# Patient Record
Sex: Male | Born: 1985 | Race: Black or African American | Hispanic: No | Marital: Single
Health system: Southern US, Community
[De-identification: ages and names within clinical notes are randomized; demographics above are authoritative.]

## PROBLEM LIST (undated history)

## (undated) DIAGNOSIS — R569 Unspecified convulsions: Secondary | ICD-10-CM

## (undated) DIAGNOSIS — Z945 Skin transplant status: Secondary | ICD-10-CM

## (undated) DIAGNOSIS — I82409 Acute embolism and thrombosis of unspecified deep veins of unspecified lower extremity: Secondary | ICD-10-CM

## (undated) DIAGNOSIS — S069XAA Unspecified intracranial injury with loss of consciousness status unknown, initial encounter: Secondary | ICD-10-CM

## (undated) HISTORY — DX: Unspecified convulsions: R56.9

---
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
## 2010-05-19 ENCOUNTER — Ambulatory Visit (INDEPENDENT_AMBULATORY_CARE_PROVIDER_SITE_OTHER): Payer: Self-pay

## 2010-05-19 ENCOUNTER — Inpatient Hospital Stay (INDEPENDENT_AMBULATORY_CARE_PROVIDER_SITE_OTHER)
Admission: RE | Admit: 2010-05-19 | Discharge: 2010-05-19 | Disposition: A | Discharge status: Home / Self Care | Payer: Self-pay | Source: Ambulatory Visit | Attending: Emergency Medicine | Admitting: Emergency Medicine

## 2010-05-19 DIAGNOSIS — S6390XA Sprain of unspecified part of unspecified wrist and hand, initial encounter: Secondary | ICD-10-CM

## 2010-05-19 DIAGNOSIS — R05 Cough: Secondary | ICD-10-CM

## 2010-08-01 ENCOUNTER — Emergency Department (HOSPITAL_COMMUNITY)
Admission: EM | Admit: 2010-08-01 | Discharge: 2010-08-01 | Disposition: A | Discharge status: Home / Self Care | Payer: Self-pay | Attending: Emergency Medicine | Admitting: Emergency Medicine

## 2010-08-01 ENCOUNTER — Emergency Department (HOSPITAL_COMMUNITY): Payer: Self-pay

## 2010-08-01 DIAGNOSIS — S81809A Unspecified open wound, unspecified lower leg, initial encounter: Secondary | ICD-10-CM | POA: Insufficient documentation

## 2010-08-01 DIAGNOSIS — S81009A Unspecified open wound, unspecified knee, initial encounter: Secondary | ICD-10-CM | POA: Insufficient documentation

## 2010-08-01 DIAGNOSIS — W3400XA Accidental discharge from unspecified firearms or gun, initial encounter: Secondary | ICD-10-CM | POA: Insufficient documentation

## 2010-08-01 DIAGNOSIS — M79609 Pain in unspecified limb: Secondary | ICD-10-CM | POA: Insufficient documentation
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
## 2010-08-28 ENCOUNTER — Emergency Department (HOSPITAL_COMMUNITY)
Admission: EM | Admit: 2010-08-28 | Discharge: 2010-08-28 | Disposition: A | Discharge status: Home / Self Care | Payer: Self-pay | Attending: Emergency Medicine | Admitting: Emergency Medicine

## 2010-08-28 ENCOUNTER — Emergency Department (HOSPITAL_COMMUNITY): Payer: Self-pay

## 2010-08-28 DIAGNOSIS — M7989 Other specified soft tissue disorders: Secondary | ICD-10-CM | POA: Insufficient documentation

## 2010-08-28 DIAGNOSIS — S61409A Unspecified open wound of unspecified hand, initial encounter: Secondary | ICD-10-CM | POA: Insufficient documentation

## 2010-08-28 DIAGNOSIS — M79609 Pain in unspecified limb: Secondary | ICD-10-CM | POA: Insufficient documentation

## 2010-08-28 DIAGNOSIS — Y92009 Unspecified place in unspecified non-institutional (private) residence as the place of occurrence of the external cause: Secondary | ICD-10-CM | POA: Insufficient documentation

## 2010-08-28 LAB — URINALYSIS, ROUTINE W REFLEX MICROSCOPIC
Leukocytes, UA: NEGATIVE
Nitrite: NEGATIVE
Specific Gravity, Urine: 1.025 (ref 1.005–1.030)
pH: 6 (ref 5.0–8.0)

## 2010-08-28 LAB — DIFFERENTIAL
Eosinophils Absolute: 0.1 10*3/uL (ref 0.0–0.7)
Lymphocytes Relative: 17 % (ref 12–46)
Lymphs Abs: 1.3 10*3/uL (ref 0.7–4.0)
Monocytes Relative: 9 % (ref 3–12)
Neutro Abs: 5.6 10*3/uL (ref 1.7–7.7)
Neutrophils Relative %: 72 % (ref 43–77)

## 2010-08-28 LAB — ETHANOL: Alcohol, Ethyl (B): 182 mg/dL — ABNORMAL HIGH (ref 0–11)

## 2010-08-28 LAB — BASIC METABOLIC PANEL
Calcium: 9.2 mg/dL (ref 8.4–10.5)
GFR calc Af Amer: >60 mL/min (ref >60)
GFR calc non Af Amer: >60 mL/min (ref >60)
Potassium: 4.8 mEq/L (ref 3.5–5.1)
Sodium: 140 mEq/L (ref 135–145)

## 2010-08-28 LAB — CBC
MCV: 82.4 fL (ref 78.0–100.0)
Platelets: 189 10*3/uL (ref 150–400)
RBC: 4.78 MIL/uL (ref 4.22–5.81)
WBC: 7.7 10*3/uL (ref 4.0–10.5)

## 2010-08-28 LAB — RAPID URINE DRUG SCREEN, HOSP PERFORMED
Benzodiazepines: NOT DETECTED
Cocaine: NOT DETECTED

## 2012-07-08 ENCOUNTER — Encounter (HOSPITAL_COMMUNITY): Payer: Self-pay | Admitting: *Deleted

## 2012-07-08 ENCOUNTER — Emergency Department (HOSPITAL_COMMUNITY)
Admission: EM | Admit: 2012-07-08 | Discharge: 2012-07-09 | Disposition: A | Discharge status: Home / Self Care | Payer: Self-pay | Attending: Emergency Medicine | Admitting: Emergency Medicine

## 2012-07-08 DIAGNOSIS — S31109A Unspecified open wound of abdominal wall, unspecified quadrant without penetration into peritoneal cavity, initial encounter: Secondary | ICD-10-CM | POA: Insufficient documentation

## 2012-07-08 DIAGNOSIS — S31119A Laceration without foreign body of abdominal wall, unspecified quadrant without penetration into peritoneal cavity, initial encounter: Secondary | ICD-10-CM

## 2012-07-08 DIAGNOSIS — F17200 Nicotine dependence, unspecified, uncomplicated: Secondary | ICD-10-CM | POA: Insufficient documentation

## 2012-07-08 DIAGNOSIS — T7411XA Adult physical abuse, confirmed, initial encounter: Secondary | ICD-10-CM | POA: Insufficient documentation

## 2012-07-08 MED ORDER — CEFAZOLIN SODIUM-DEXTROSE 2-3 GM-% IV SOLR
2.0000 g | Freq: Once | INTRAVENOUS | Status: AC
  Administered 2012-07-08: 2 g via INTRAVENOUS
  Filled 2012-07-08: qty 50

## 2012-07-08 MED ORDER — CEFAZOLIN SODIUM 1-5 GM-% IV SOLN: 1.0000 g | Freq: Once | INTRAVENOUS | Status: DC

## 2012-07-08 MED ORDER — HYDROCODONE-ACETAMINOPHEN 5-325 MG PO TABS: 2.0000 | ORAL_TABLET | ORAL | Status: DC | PRN

## 2012-07-08 MED ORDER — CEPHALEXIN 500 MG PO CAPS: 500.0000 mg | ORAL_CAPSULE | Freq: Four times a day (QID) | ORAL | Status: DC
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
## 2012-07-09 ENCOUNTER — Encounter (HOSPITAL_COMMUNITY): Payer: Self-pay | Admitting: *Deleted

## 2012-07-19 ENCOUNTER — Encounter (HOSPITAL_COMMUNITY): Payer: Self-pay | Admitting: Adult Health

## 2012-07-19 ENCOUNTER — Emergency Department (HOSPITAL_COMMUNITY)
Admission: EM | Admit: 2012-07-19 | Discharge: 2012-07-19 | Disposition: A | Discharge status: Home / Self Care | Payer: Self-pay | Attending: Emergency Medicine | Admitting: Emergency Medicine

## 2012-07-19 DIAGNOSIS — Z4802 Encounter for removal of sutures: Secondary | ICD-10-CM | POA: Insufficient documentation

## 2012-07-19 DIAGNOSIS — F17200 Nicotine dependence, unspecified, uncomplicated: Secondary | ICD-10-CM | POA: Insufficient documentation

## 2012-07-19 NOTE — ED Notes (Signed)
17 staples to right flank and side, placed 10 days ago. Pt requesting removal. Incision site cdi.
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
## 2012-07-19 NOTE — ED Notes (Signed)
PT comfortable with d/c and f/u instructions. No prescriptions. 

## 2012-07-20 NOTE — ED Provider Notes (Signed)
Medical screening examination/treatment/procedure(s) were performed by non-physician practitioner and as supervising physician I was immediately available for consultation/collaboration.  Lyanne Co, MD 07/20/12 [phone]

## 2015-02-12 ENCOUNTER — Emergency Department (INDEPENDENT_AMBULATORY_CARE_PROVIDER_SITE_OTHER)
Admission: EM | Admit: 2015-02-12 | Discharge: 2015-02-12 | Disposition: A | Discharge status: Home / Self Care | Payer: Self-pay | Source: Home / Self Care | Attending: Family Medicine | Admitting: Family Medicine

## 2015-02-12 ENCOUNTER — Encounter (HOSPITAL_COMMUNITY): Payer: Self-pay | Admitting: *Deleted

## 2015-02-12 ENCOUNTER — Other Ambulatory Visit (HOSPITAL_COMMUNITY)
Admission: RE | Admit: 2015-02-12 | Discharge: 2015-02-12 | Disposition: A | Discharge status: Home / Self Care | Payer: Self-pay | Source: Ambulatory Visit | Attending: Family Medicine | Admitting: Family Medicine

## 2015-02-12 DIAGNOSIS — Z711 Person with feared health complaint in whom no diagnosis is made: Secondary | ICD-10-CM

## 2015-02-12 DIAGNOSIS — Z113 Encounter for screening for infections with a predominantly sexual mode of transmission: Secondary | ICD-10-CM | POA: Insufficient documentation

## 2015-02-12 MED ORDER — AZITHROMYCIN 250 MG PO TABS
1000.0000 mg | ORAL_TABLET | Freq: Once | ORAL | Status: AC
  Administered 2015-02-12: 1000 mg via ORAL

## 2015-02-12 MED ORDER — CEFTRIAXONE SODIUM 250 MG IJ SOLR
INTRAMUSCULAR | Status: AC
  Filled 2015-02-12: qty 250

## 2015-02-12 MED ORDER — AZITHROMYCIN 250 MG PO TABS
ORAL_TABLET | ORAL | Status: AC
  Filled 2015-02-12: qty 4

## 2015-02-12 MED ORDER — CEFTRIAXONE SODIUM 250 MG IJ SOLR
250.0000 mg | Freq: Once | INTRAMUSCULAR | Status: AC
  Administered 2015-02-12: 250 mg via INTRAMUSCULAR

## 2015-02-12 NOTE — Discharge Instructions (Signed)
We will call with positive test results and treat as indicated  °
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
## 2015-02-12 NOTE — ED Notes (Signed)
Pt reports   Symptoms    Of        Burning   When   He  Urinates  As  Well  As  A  Penile  Discharge      Symptoms  Started  yesterday

## 2015-02-13 LAB — CYTOLOGY, (ORAL, ANAL, URETHRAL) ANCILLARY ONLY
CHLAMYDIA, DNA PROBE: POSITIVE — AB
Neisseria Gonorrhea: NEGATIVE
Trichomonas: NEGATIVE

## 2015-02-17 ENCOUNTER — Telehealth (HOSPITAL_COMMUNITY): Payer: Self-pay | Admitting: Emergency Medicine
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
## 2017-06-02 ENCOUNTER — Emergency Department (HOSPITAL_COMMUNITY)
Admission: EM | Admit: 2017-06-02 | Discharge: 2017-06-02 | Disposition: A | Discharge status: Home / Self Care | Payer: Self-pay | Attending: Emergency Medicine | Admitting: Emergency Medicine

## 2017-06-02 ENCOUNTER — Encounter (HOSPITAL_COMMUNITY): Payer: Self-pay

## 2017-06-02 DIAGNOSIS — F17210 Nicotine dependence, cigarettes, uncomplicated: Secondary | ICD-10-CM | POA: Insufficient documentation

## 2017-06-02 DIAGNOSIS — H209 Unspecified iridocyclitis: Secondary | ICD-10-CM

## 2017-06-02 DIAGNOSIS — H2000 Unspecified acute and subacute iridocyclitis: Secondary | ICD-10-CM | POA: Insufficient documentation

## 2017-06-02 HISTORY — DX: Skin transplant status: Z94.5

## 2017-06-02 MED ORDER — ATROPINE SULFATE 1 % OP SOLN
1.0000 [drp] | Freq: Once | OPHTHALMIC | Status: AC
  Administered 2017-06-02: 1 [drp] via OPHTHALMIC
  Filled 2017-06-02: qty 2

## 2017-06-02 MED ORDER — ERYTHROMYCIN 5 MG/GM OP OINT: TOPICAL_OINTMENT | OPHTHALMIC | 0 refills | Status: DC

## 2017-06-02 MED ORDER — OXYCODONE-ACETAMINOPHEN 5-325 MG PO TABS
2.0000 | ORAL_TABLET | Freq: Once | ORAL | Status: AC
  Administered 2017-06-02: 2 via ORAL
  Filled 2017-06-02: qty 2

## 2017-06-02 MED ORDER — TETRACAINE HCL 0.5 % OP SOLN
2.0000 [drp] | Freq: Once | OPHTHALMIC | Status: AC
  Administered 2017-06-02: 2 [drp] via OPHTHALMIC
  Filled 2017-06-02: qty 4

## 2017-06-02 MED ORDER — HOMATROPINE HBR 2 % OP SOLN
1.0000 [drp] | Freq: Once | OPHTHALMIC | Status: DC
  Filled 2017-06-02: qty 5

## 2017-06-02 MED ORDER — PREDNISOLONE ACETATE 1 % OP SUSP
1.0000 [drp] | Freq: Four times a day (QID) | OPHTHALMIC | Status: DC
  Filled 2017-06-02: qty 1

## 2017-06-02 MED ORDER — FLUORESCEIN SODIUM 1 MG OP STRP
1.0000 | ORAL_STRIP | Freq: Once | OPHTHALMIC | Status: AC
  Administered 2017-06-02: 1 via OPHTHALMIC
  Filled 2017-06-02: qty 1

## 2017-06-02 MED ORDER — HYDROCODONE-ACETAMINOPHEN 5-325 MG PO TABS: 1.0000 | ORAL_TABLET | Freq: Four times a day (QID) | ORAL | 0 refills | Status: DC | PRN

## 2017-06-02 NOTE — Discharge Instructions (Addendum)
Start using the PRED-FORTE eye drops four times a day  Do not scratch or rub your eye, as this can cause cuts or abrasions which can become infected. I've given you a mild antibiotic ointment to use to help keep your eye from getting infected.  It is very important that you follow-up tomorrow.  Failure to do so could result in worsening vision, loss of vision, pain, infection, or other complications.

## 2017-06-02 NOTE — ED Triage Notes (Signed)
Pt states that his R eye was watering and irritated yesterday, today unable to open his eye due to pain and drainage, conjunctiva very red
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
## 2017-06-02 NOTE — ED Notes (Signed)
Patient verbalizes understanding of discharge instructions. Opportunity for questioning and answers were provided. Armband removed by staff, pt discharged from ED ambulatory.   

## 2017-08-25 ENCOUNTER — Encounter (HOSPITAL_COMMUNITY): Payer: Self-pay | Admitting: Emergency Medicine

## 2017-08-25 ENCOUNTER — Ambulatory Visit (HOSPITAL_COMMUNITY)
Admission: EM | Admit: 2017-08-25 | Discharge: 2017-08-25 | Disposition: A | Discharge status: Home / Self Care | Payer: Self-pay | Attending: Family Medicine | Admitting: Family Medicine

## 2017-08-25 DIAGNOSIS — Z202 Contact with and (suspected) exposure to infections with a predominantly sexual mode of transmission: Secondary | ICD-10-CM

## 2017-08-25 DIAGNOSIS — Z113 Encounter for screening for infections with a predominantly sexual mode of transmission: Secondary | ICD-10-CM | POA: Insufficient documentation

## 2017-08-25 MED ORDER — METRONIDAZOLE 500 MG PO TABS: 500.0000 mg | ORAL_TABLET | Freq: Two times a day (BID) | ORAL | 0 refills | Status: DC

## 2017-08-25 NOTE — Discharge Instructions (Addendum)
You have been given the following medications today for treatment of suspected trichomoniasis:  Meds ordered this encounter  Medications   metroNIDAZOLE (FLAGYL) 500 MG tablet    Sig: Take 1 tablet (500 mg total) by mouth 2 (two) times daily.    Dispense:  14 tablet    Refill:  0   Even though we have treated you today, we have sent testing for sexually transmitted infections. We will notify you of any positive results once they are received. If required, we will prescribe any medications you might need.  Please refrain from all sexual activity for at least the next seven days.

## 2017-08-25 NOTE — ED Triage Notes (Signed)
PT reports "tingling" in genital area. A partner was positive for trich.
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
## 2017-08-26 LAB — URINE CYTOLOGY ANCILLARY ONLY
Chlamydia: NEGATIVE
NEISSERIA GONORRHEA: NEGATIVE
TRICH (WINDOWPATH): NEGATIVE

## 2018-02-16 ENCOUNTER — Encounter (HOSPITAL_COMMUNITY): Payer: Self-pay | Admitting: Emergency Medicine

## 2018-02-16 ENCOUNTER — Ambulatory Visit (HOSPITAL_COMMUNITY)
Admission: EM | Admit: 2018-02-16 | Discharge: 2018-02-16 | Disposition: A | Discharge status: Home / Self Care | Payer: Self-pay | Attending: Family Medicine | Admitting: Family Medicine

## 2018-02-16 DIAGNOSIS — K047 Periapical abscess without sinus: Secondary | ICD-10-CM | POA: Insufficient documentation

## 2018-02-16 MED ORDER — AMOXICILLIN-POT CLAVULANATE 875-125 MG PO TABS: 1.0000 | ORAL_TABLET | Freq: Two times a day (BID) | ORAL | 0 refills | Status: DC

## 2018-02-16 NOTE — ED Triage Notes (Signed)
[facility] dental pain x 3-4 days.  Pt states he took 2 800mg  ibuprofen prior to arrival.

## 2018-02-16 NOTE — Discharge Instructions (Signed)
We are treating you for a dental infection You can continue the ibuprofen for pain and inflammation Follow-up with a dentist for further evaluation management
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
## 2018-11-03 ENCOUNTER — Other Ambulatory Visit: Payer: Self-pay

## 2018-11-03 ENCOUNTER — Ambulatory Visit (HOSPITAL_COMMUNITY)
Admission: EM | Admit: 2018-11-03 | Discharge: 2018-11-03 | Disposition: A | Discharge status: Home / Self Care | Payer: Self-pay | Attending: Physician Assistant | Admitting: Physician Assistant

## 2018-11-03 ENCOUNTER — Encounter (HOSPITAL_COMMUNITY): Payer: Self-pay

## 2018-11-03 ENCOUNTER — Emergency Department (HOSPITAL_COMMUNITY)
Admission: EM | Admit: 2018-11-03 | Discharge: 2018-11-03 | Disposition: A | Discharge status: Home / Self Care | Payer: Self-pay

## 2018-11-03 DIAGNOSIS — S0181XA Laceration without foreign body of other part of head, initial encounter: Secondary | ICD-10-CM

## 2018-11-03 DIAGNOSIS — S0081XA Abrasion of other part of head, initial encounter: Secondary | ICD-10-CM

## 2018-11-03 DIAGNOSIS — S0990XA Unspecified injury of head, initial encounter: Secondary | ICD-10-CM
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
## 2018-11-03 NOTE — Discharge Instructions (Addendum)
Closed the left eyebrow injury as best as I could. Keep all areas clean and dry. Do not pull the steri-strips off let them come off on their own. If you develop worsening headaches, nausea or vomiting then please go to the ED.

## 2018-11-03 NOTE — ED Notes (Signed)
Pt's name called for triage no answer x1 
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
## 2018-11-04 ENCOUNTER — Telehealth (HOSPITAL_COMMUNITY): Payer: Self-pay | Admitting: Emergency Medicine

## 2018-11-05 ENCOUNTER — Emergency Department (HOSPITAL_COMMUNITY): Payer: Self-pay

## 2018-11-05 ENCOUNTER — Encounter (HOSPITAL_COMMUNITY): Payer: Self-pay | Admitting: Emergency Medicine

## 2018-11-05 ENCOUNTER — Other Ambulatory Visit: Payer: Self-pay

## 2018-11-05 ENCOUNTER — Emergency Department (HOSPITAL_COMMUNITY)
Admission: EM | Admit: 2018-11-05 | Discharge: 2018-11-05 | Disposition: A | Discharge status: Home / Self Care | Payer: Self-pay | Attending: Emergency Medicine | Admitting: Emergency Medicine

## 2018-11-05 DIAGNOSIS — G8911 Acute pain due to trauma: Secondary | ICD-10-CM | POA: Insufficient documentation

## 2018-11-05 DIAGNOSIS — F17210 Nicotine dependence, cigarettes, uncomplicated: Secondary | ICD-10-CM | POA: Insufficient documentation

## 2018-11-05 DIAGNOSIS — S0993XD Unspecified injury of face, subsequent encounter: Secondary | ICD-10-CM

## 2018-11-05 DIAGNOSIS — S01112D Laceration without foreign body of left eyelid and periocular area, subsequent encounter: Secondary | ICD-10-CM | POA: Diagnosis not present

## 2018-11-05 DIAGNOSIS — Z09 Encounter for follow-up examination after completed treatment for conditions other than malignant neoplasm: Secondary | ICD-10-CM | POA: Diagnosis present

## 2018-11-05 MED ORDER — FOLIC ACID 1 MG PO TABS
1.0000 mg | ORAL_TABLET | Freq: Once | ORAL | Status: AC
  Administered 2018-11-05: 1 mg via ORAL
  Filled 2018-11-05: qty 1

## 2018-11-05 MED ORDER — THIAMINE HCL 100 MG/ML IJ SOLN: 100.0000 mg | Freq: Once | INTRAMUSCULAR | Status: DC

## 2018-11-05 MED ORDER — VITAMIN B-1 100 MG PO TABS
50.0000 mg | ORAL_TABLET | Freq: Once | ORAL | Status: AC
  Administered 2018-11-05: 50 mg via ORAL
  Filled 2018-11-05: qty 1

## 2018-11-05 MED ORDER — CEPHALEXIN 500 MG PO CAPS: 500.0000 mg | ORAL_CAPSULE | Freq: Four times a day (QID) | ORAL | 0 refills | Status: AC
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
## 2018-11-05 NOTE — ED Notes (Signed)
Patient transported to CT 
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
## 2019-10-08 ENCOUNTER — Encounter (HOSPITAL_COMMUNITY): Payer: Self-pay | Admitting: Neurological Surgery

## 2019-10-08 ENCOUNTER — Inpatient Hospital Stay (HOSPITAL_COMMUNITY): Payer: No Typology Code available for payment source

## 2019-10-08 ENCOUNTER — Emergency Department (HOSPITAL_COMMUNITY): Payer: No Typology Code available for payment source

## 2019-10-08 ENCOUNTER — Other Ambulatory Visit: Payer: Self-pay

## 2019-10-08 ENCOUNTER — Inpatient Hospital Stay (HOSPITAL_COMMUNITY)
Admission: EM | Admit: 2019-10-08 | Discharge: 2019-10-11 | DRG: 087 | Disposition: A | Discharge status: Home / Self Care | Payer: No Typology Code available for payment source | Attending: Neurological Surgery | Admitting: Neurological Surgery

## 2019-10-08 DIAGNOSIS — R402412 Glasgow coma scale score 13-15, at arrival to emergency department: Secondary | ICD-10-CM | POA: Diagnosis present

## 2019-10-08 DIAGNOSIS — Z20822 Contact with and (suspected) exposure to covid-19: Secondary | ICD-10-CM | POA: Diagnosis present

## 2019-10-08 DIAGNOSIS — S065XAA Traumatic subdural hemorrhage with loss of consciousness status unknown, initial encounter: Secondary | ICD-10-CM | POA: Diagnosis present

## 2019-10-08 DIAGNOSIS — F10129 Alcohol abuse with intoxication, unspecified: Secondary | ICD-10-CM | POA: Diagnosis present

## 2019-10-08 DIAGNOSIS — S066X1A Traumatic subarachnoid hemorrhage with loss of consciousness of 30 minutes or less, initial encounter: Principal | ICD-10-CM | POA: Diagnosis present

## 2019-10-08 DIAGNOSIS — S065X1A Traumatic subdural hemorrhage with loss of consciousness of 30 minutes or less, initial encounter: Secondary | ICD-10-CM | POA: Diagnosis present

## 2019-10-08 DIAGNOSIS — I629 Nontraumatic intracranial hemorrhage, unspecified: Secondary | ICD-10-CM

## 2019-10-08 DIAGNOSIS — F17200 Nicotine dependence, unspecified, uncomplicated: Secondary | ICD-10-CM | POA: Diagnosis present

## 2019-10-08 DIAGNOSIS — S065X9A Traumatic subdural hemorrhage with loss of consciousness of unspecified duration, initial encounter: Secondary | ICD-10-CM | POA: Diagnosis not present

## 2019-10-08 LAB — COMPREHENSIVE METABOLIC PANEL
ALT: 53 U/L — ABNORMAL HIGH (ref 0–44)
AST: 68 U/L — ABNORMAL HIGH (ref 15–41)
Albumin: 4.5 g/dL (ref 3.5–5.0)
Alkaline Phosphatase: 62 U/L (ref 38–126)
Anion gap: 12 (ref 5–15)
BUN: 9 mg/dL (ref 6–20)
CO2: 24 mmol/L (ref 22–32)
Calcium: 8.8 mg/dL — ABNORMAL LOW (ref 8.9–10.3)
Chloride: 107 mmol/L (ref 98–111)
Creatinine, Ser: 0.94 mg/dL (ref 0.61–1.24)
GFR calc Af Amer: >60 mL/min (ref >60)
GFR calc non Af Amer: >60 mL/min (ref >60)
Glucose, Bld: 100 mg/dL — ABNORMAL HIGH (ref 70–99)
Potassium: 3.4 mmol/L — ABNORMAL LOW (ref 3.5–5.1)
Sodium: 143 mmol/L (ref 135–145)
Total Bilirubin: 0.8 mg/dL (ref 0.3–1.2)
Total Protein: 7.6 g/dL (ref 6.5–8.1)

## 2019-10-08 LAB — CBC
HCT: 32.7 % — ABNORMAL LOW (ref 39.0–52.0)
Hemoglobin: 10.2 g/dL — ABNORMAL LOW (ref 13.0–17.0)
MCH: 27.1 pg (ref 26.0–34.0)
MCHC: 31.2 g/dL (ref 30.0–36.0)
MCV: 86.7 fL (ref 80.0–100.0)
Platelets: 147 10*3/uL — ABNORMAL LOW (ref 150–400)
RBC: 3.77 MIL/uL — ABNORMAL LOW (ref 4.22–5.81)
RDW: 13.7 % (ref 11.5–15.5)
WBC: 8.1 10*3/uL (ref 4.0–10.5)
nRBC: 0 % (ref 0.0–0.2)

## 2019-10-08 LAB — MRSA PCR SCREENING: MRSA by PCR: NEGATIVE

## 2019-10-08 LAB — I-STAT CHEM 8, ED
BUN: 9 mg/dL (ref 6–20)
Calcium, Ion: 1.04 mmol/L — ABNORMAL LOW (ref 1.15–1.40)
Chloride: 106 mmol/L (ref 98–111)
Creatinine, Ser: 1.2 mg/dL (ref 0.61–1.24)
Glucose, Bld: 97 mg/dL (ref 70–99)
HCT: 46 % (ref 39.0–52.0)
Hemoglobin: 15.6 g/dL (ref 13.0–17.0)
Potassium: 3.5 mmol/L (ref 3.5–5.1)
Sodium: 145 mmol/L (ref 135–145)
TCO2: 24 mmol/L (ref 22–32)

## 2019-10-08 LAB — SAMPLE TO BLOOD BANK

## 2019-10-08 LAB — LACTIC ACID, PLASMA: Lactic Acid, Venous: 2.8 mmol/L (ref 0.5–1.9)

## 2019-10-08 LAB — ETHANOL: Alcohol, Ethyl (B): 198 mg/dL — ABNORMAL HIGH (ref <10)

## 2019-10-08 LAB — PROTIME-INR
INR: 1 (ref 0.8–1.2)
Prothrombin Time: 12.8 seconds (ref 11.4–15.2)

## 2019-10-08 LAB — SARS CORONAVIRUS 2 BY RT PCR (HOSPITAL ORDER, PERFORMED IN ~~LOC~~ HOSPITAL LAB): SARS Coronavirus 2: NEGATIVE

## 2019-10-08 MED ORDER — POLYETHYLENE GLYCOL 3350 17 G PO PACK: 17.0000 g | PACK | Freq: Every day | ORAL | Status: DC | PRN

## 2019-10-08 MED ORDER — ADULT MULTIVITAMIN W/MINERALS CH: 1.0000 | ORAL_TABLET | Freq: Every day | ORAL | Status: DC

## 2019-10-08 MED ORDER — CHLORHEXIDINE GLUCONATE CLOTH 2 % EX PADS
6.0000 | MEDICATED_PAD | Freq: Every day | CUTANEOUS | Status: DC
  Administered 2019-10-08: 6 via TOPICAL

## 2019-10-08 MED ORDER — DOCUSATE SODIUM 100 MG PO CAPS: 100.0000 mg | ORAL_CAPSULE | Freq: Two times a day (BID) | ORAL | Status: DC | PRN

## 2019-10-08 MED ORDER — FOLIC ACID 1 MG PO TABS: 1.0000 mg | ORAL_TABLET | Freq: Every day | ORAL | Status: DC

## 2019-10-08 MED ORDER — LEVETIRACETAM IN NACL 500 MG/100ML IV SOLN
500.0000 mg | Freq: Two times a day (BID) | INTRAVENOUS | Status: DC
  Administered 2019-10-09: 500 mg via INTRAVENOUS
  Filled 2019-10-08: qty 100

## 2019-10-08 MED ORDER — HYDROCODONE-ACETAMINOPHEN 5-325 MG PO TABS
1.0000 | ORAL_TABLET | Freq: Four times a day (QID) | ORAL | Status: DC | PRN
  Administered 2019-10-09 – 2019-10-10 (×4): 1 via ORAL
  Filled 2019-10-08 (×5): qty 1

## 2019-10-08 MED ORDER — IOHEXOL 300 MG/ML  SOLN
100.0000 mL | Freq: Once | INTRAMUSCULAR | Status: AC | PRN
  Administered 2019-10-08: 100 mL via INTRAVENOUS

## 2019-10-08 MED ORDER — ADULT MULTIVITAMIN W/MINERALS CH
1.0000 | ORAL_TABLET | Freq: Every day | ORAL | Status: DC
  Administered 2019-10-08 – 2019-10-11 (×3): 1 via ORAL
  Filled 2019-10-08 (×3): qty 1

## 2019-10-08 MED ORDER — LORAZEPAM 2 MG/ML IJ SOLN: 1.0000 mg | INTRAMUSCULAR | Status: DC | PRN

## 2019-10-08 MED ORDER — ONDANSETRON HCL 4 MG/2ML IJ SOLN
4.0000 mg | Freq: Four times a day (QID) | INTRAMUSCULAR | Status: DC | PRN
  Administered 2019-10-09: 4 mg via INTRAVENOUS
  Filled 2019-10-08: qty 2

## 2019-10-08 MED ORDER — ACETAMINOPHEN 500 MG PO TABS
1000.0000 mg | ORAL_TABLET | Freq: Once | ORAL | Status: AC
  Administered 2019-10-08: 1000 mg via ORAL
  Filled 2019-10-08: qty 2

## 2019-10-08 MED ORDER — LEVETIRACETAM IN NACL 1000 MG/100ML IV SOLN
1000.0000 mg | Freq: Once | INTRAVENOUS | Status: AC
  Administered 2019-10-08: 1000 mg via INTRAVENOUS
  Filled 2019-10-08: qty 100

## 2019-10-08 MED ORDER — LORAZEPAM 1 MG PO TABS
1.0000 mg | ORAL_TABLET | ORAL | Status: DC | PRN
  Administered 2019-10-09 – 2019-10-10 (×2): 2 mg via ORAL
  Filled 2019-10-08 (×2): qty 2

## 2019-10-08 MED ORDER — THIAMINE HCL 100 MG PO TABS: 100.0000 mg | ORAL_TABLET | Freq: Every day | ORAL | Status: DC

## 2019-10-08 MED ORDER — FAMOTIDINE IN NACL 20-0.9 MG/50ML-% IV SOLN
20.0000 mg | Freq: Two times a day (BID) | INTRAVENOUS | Status: DC
  Administered 2019-10-08: 20 mg via INTRAVENOUS
  Filled 2019-10-08: qty 50

## 2019-10-08 MED ORDER — FOLIC ACID 1 MG PO TABS
1.0000 mg | ORAL_TABLET | Freq: Every day | ORAL | Status: DC
  Administered 2019-10-08 – 2019-10-11 (×3): 1 mg via ORAL
  Filled 2019-10-08 (×3): qty 1

## 2019-10-08 MED ORDER — ONDANSETRON 4 MG PO TBDP
4.0000 mg | ORAL_TABLET | Freq: Once | ORAL | Status: AC
  Administered 2019-10-08: 4 mg via ORAL
  Filled 2019-10-08: qty 1

## 2019-10-08 MED ORDER — THIAMINE HCL 100 MG/ML IJ SOLN: 100.0000 mg | Freq: Every day | INTRAMUSCULAR | Status: DC

## 2019-10-08 MED ORDER — ACETAMINOPHEN 325 MG PO TABS
650.0000 mg | ORAL_TABLET | ORAL | Status: DC | PRN
  Administered 2019-10-08: 650 mg via ORAL
  Filled 2019-10-08: qty 2

## 2019-10-08 MED ORDER — SODIUM CHLORIDE 0.9 % IV SOLN: INTRAVENOUS | Status: DC

## 2019-10-08 MED ORDER — THIAMINE HCL 100 MG PO TABS
100.0000 mg | ORAL_TABLET | Freq: Every day | ORAL | Status: DC
  Administered 2019-10-08 – 2019-10-11 (×3): 100 mg via ORAL
  Filled 2019-10-08 (×3): qty 1
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
## 2019-10-08 NOTE — ED Notes (Signed)
Nurse attempted IV access x1, unsuccessful
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
## 2019-10-08 NOTE — ED Notes (Signed)
Pt transported to CT ?

## 2019-10-09 ENCOUNTER — Inpatient Hospital Stay (HOSPITAL_COMMUNITY): Payer: No Typology Code available for payment source

## 2019-10-09 LAB — CBC
HCT: 42.9 % (ref 39.0–52.0)
Hemoglobin: 14.1 g/dL (ref 13.0–17.0)
MCH: 27.6 pg (ref 26.0–34.0)
MCHC: 32.9 g/dL (ref 30.0–36.0)
MCV: 84 fL (ref 80.0–100.0)
Platelets: 206 10*3/uL (ref 150–400)
RBC: 5.11 MIL/uL (ref 4.22–5.81)
RDW: 13.6 % (ref 11.5–15.5)
WBC: 9.8 10*3/uL (ref 4.0–10.5)
nRBC: 0 % (ref 0.0–0.2)

## 2019-10-09 LAB — BASIC METABOLIC PANEL
Anion gap: 13 (ref 5–15)
BUN: 9 mg/dL (ref 6–20)
CO2: 24 mmol/L (ref 22–32)
Calcium: 9 mg/dL (ref 8.9–10.3)
Chloride: 101 mmol/L (ref 98–111)
Creatinine, Ser: 0.93 mg/dL (ref 0.61–1.24)
GFR calc Af Amer: >60 mL/min (ref >60)
GFR calc non Af Amer: >60 mL/min (ref >60)
Glucose, Bld: 115 mg/dL — ABNORMAL HIGH (ref 70–99)
Potassium: 3.7 mmol/L (ref 3.5–5.1)
Sodium: 138 mmol/L (ref 135–145)

## 2019-10-09 LAB — HIV ANTIBODY (ROUTINE TESTING W REFLEX): HIV Screen 4th Generation wRfx: NONREACTIVE

## 2019-10-09 MED ORDER — LEVETIRACETAM 500 MG PO TABS
500.0000 mg | ORAL_TABLET | Freq: Two times a day (BID) | ORAL | Status: DC
  Administered 2019-10-09 – 2019-10-11 (×4): 500 mg via ORAL
  Filled 2019-10-09 (×4): qty 1

## 2019-10-09 NOTE — Progress Notes (Signed)
Pt refusing care and wanting to go home against medical advice.  Dr. Jake Samples at bedside and explained risks of going home against medical advice.  Will bring in [facility] paperwork for pt to sign.
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
## 2019-10-09 NOTE — Progress Notes (Signed)
Pt refusing IV and wanting to go home.  Dr Janee Morn at bedside.
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
## 2019-10-10 MED ORDER — OXYCODONE HCL 5 MG PO TABS
5.0000 mg | ORAL_TABLET | ORAL | Status: DC | PRN
  Administered 2019-10-10 (×2): 10 mg via ORAL
  Filled 2019-10-10 (×2): qty 2

## 2019-10-10 MED ORDER — HYDROMORPHONE HCL 1 MG/ML IJ SOLN
0.5000 mg | INTRAMUSCULAR | Status: DC | PRN
  Administered 2019-10-10: 1 mg via INTRAVENOUS

## 2019-10-10 MED ORDER — HYDROMORPHONE HCL 1 MG/ML PO LIQD: 0.5000 mg | ORAL | Status: DC | PRN

## 2019-10-10 MED ORDER — HYDROMORPHONE HCL 1 MG/ML IJ SOLN
INTRAMUSCULAR | Status: AC
  Filled 2019-10-10: qty 1

## 2019-10-10 MED ORDER — FAMOTIDINE 20 MG PO TABS
20.0000 mg | ORAL_TABLET | Freq: Two times a day (BID) | ORAL | Status: DC
  Administered 2019-10-10 – 2019-10-11 (×3): 20 mg via ORAL
  Filled 2019-10-10 (×3): qty 1

## 2019-10-10 NOTE — TOC CAGE-AID Note (Signed)
Transition of Care [facility]) - CAGE-AID Screening   Patient Details  Name: Perry Day MRN: [id number] Date of Birth: November 24, 1985  Transition of [facility]) CM/SW Contact:    Jimmy Picket, LCSWA Phone Number: 10/10/2019, 1:49 PM   Clinical Narrative: Pt declined to participate in assessment.    CAGE-AID Screening: Substance Abuse Screening unable to be completed due to: : Patient Refused            Jimmy Picket, Bryon Lions Clinical Social Worker [phone]
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
## 2019-10-10 NOTE — Progress Notes (Signed)
OT Cancellation Note  Patient Details Name: Perry Day MRN: [id number] DOB: Jul 10, 1985   Cancelled Treatment:    Reason Eval/Treat Not Completed: Patient declined, no reason specified (Pt rolled to face away from therapist x2 and pulled covers up over head and would not answer questions)  Perry Day 10/10/2019, 1:14 PM   Nyoka Cowden OTR/L Acute Rehabilitation Services Pager: [phone] Office: [phone]

## 2019-10-11 MED ORDER — HYDROCODONE-ACETAMINOPHEN 5-325 MG PO TABS
1.0000 | ORAL_TABLET | Freq: Four times a day (QID) | ORAL | 0 refills | Status: DC | PRN
Start: 2019-10-11 — End: 2020-04-25

## 2019-10-11 MED ORDER — LEVETIRACETAM 500 MG PO TABS: 500.0000 mg | ORAL_TABLET | Freq: Two times a day (BID) | ORAL | 0 refills | Status: DC
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
## 2019-10-11 NOTE — Progress Notes (Signed)
CSW met with pt to discuss recommendation for outpt PT follow up.  Pt did not respond verbally but nodded his head that he did not want this follow up, closed his eyes and did not respond to further questions from [place]. Winferd Humphrey, MSW, LCSW 10/11/2019 11:57 AM

## 2019-10-13 ENCOUNTER — Other Ambulatory Visit: Payer: Self-pay

## 2019-10-13 DIAGNOSIS — F17200 Nicotine dependence, unspecified, uncomplicated: Secondary | ICD-10-CM | POA: Diagnosis present

## 2019-10-13 DIAGNOSIS — Z5320 Procedure and treatment not carried out because of patient's decision for unspecified reasons: Secondary | ICD-10-CM | POA: Diagnosis not present

## 2019-10-13 DIAGNOSIS — R40241 Glasgow coma scale score 13-15, unspecified time: Secondary | ICD-10-CM | POA: Diagnosis present

## 2019-10-13 DIAGNOSIS — Z79899 Other long term (current) drug therapy: Secondary | ICD-10-CM

## 2019-10-13 DIAGNOSIS — Y92410 Unspecified street and highway as the place of occurrence of the external cause: Secondary | ICD-10-CM

## 2019-10-13 DIAGNOSIS — R519 Headache, unspecified: Secondary | ICD-10-CM | POA: Diagnosis not present

## 2019-10-13 DIAGNOSIS — S066X9A Traumatic subarachnoid hemorrhage with loss of consciousness of unspecified duration, initial encounter: Secondary | ICD-10-CM | POA: Diagnosis not present

## 2019-10-13 DIAGNOSIS — Z20822 Contact with and (suspected) exposure to covid-19: Secondary | ICD-10-CM | POA: Diagnosis present

## 2019-10-13 DIAGNOSIS — Z5329 Procedure and treatment not carried out because of patient's decision for other reasons: Secondary | ICD-10-CM | POA: Diagnosis not present

## 2019-10-13 DIAGNOSIS — S061X9A Traumatic cerebral edema with loss of consciousness of unspecified duration, initial encounter: Secondary | ICD-10-CM | POA: Diagnosis present

## 2019-10-14 ENCOUNTER — Encounter (HOSPITAL_COMMUNITY): Payer: Self-pay | Admitting: Emergency Medicine

## 2019-10-14 ENCOUNTER — Emergency Department (HOSPITAL_COMMUNITY): Payer: No Typology Code available for payment source

## 2019-10-14 ENCOUNTER — Inpatient Hospital Stay (HOSPITAL_COMMUNITY)
Admission: EM | Admit: 2019-10-14 | Discharge: 2019-10-14 | DRG: 084 | Discharge status: Against Medical Advice | Payer: No Typology Code available for payment source | Attending: Neurological Surgery | Admitting: Neurological Surgery

## 2019-10-14 DIAGNOSIS — R9089 Other abnormal findings on diagnostic imaging of central nervous system: Secondary | ICD-10-CM

## 2019-10-14 DIAGNOSIS — S06319D Contusion and laceration of right cerebrum with loss of consciousness of unspecified duration, subsequent encounter: Secondary | ICD-10-CM

## 2019-10-14 DIAGNOSIS — Y92410 Unspecified street and highway as the place of occurrence of the external cause: Secondary | ICD-10-CM | POA: Diagnosis not present

## 2019-10-14 DIAGNOSIS — Z5329 Procedure and treatment not carried out because of patient's decision for other reasons: Secondary | ICD-10-CM | POA: Diagnosis not present

## 2019-10-14 DIAGNOSIS — S061X9A Traumatic cerebral edema with loss of consciousness of unspecified duration, initial encounter: Secondary | ICD-10-CM | POA: Diagnosis present

## 2019-10-14 DIAGNOSIS — R519 Headache, unspecified: Secondary | ICD-10-CM | POA: Diagnosis present

## 2019-10-14 DIAGNOSIS — Z20822 Contact with and (suspected) exposure to covid-19: Secondary | ICD-10-CM | POA: Diagnosis present

## 2019-10-14 DIAGNOSIS — S066XAA Traumatic subarachnoid hemorrhage with loss of consciousness status unknown, initial encounter: Secondary | ICD-10-CM | POA: Diagnosis present

## 2019-10-14 DIAGNOSIS — G936 Cerebral edema: Secondary | ICD-10-CM

## 2019-10-14 DIAGNOSIS — R40241 Glasgow coma scale score 13-15, unspecified time: Secondary | ICD-10-CM | POA: Diagnosis present

## 2019-10-14 DIAGNOSIS — Z5320 Procedure and treatment not carried out because of patient's decision for unspecified reasons: Secondary | ICD-10-CM | POA: Diagnosis not present

## 2019-10-14 DIAGNOSIS — F17200 Nicotine dependence, unspecified, uncomplicated: Secondary | ICD-10-CM | POA: Diagnosis present

## 2019-10-14 DIAGNOSIS — Z79899 Other long term (current) drug therapy: Secondary | ICD-10-CM | POA: Diagnosis not present

## 2019-10-14 DIAGNOSIS — S066X9A Traumatic subarachnoid hemorrhage with loss of consciousness of unspecified duration, initial encounter: Secondary | ICD-10-CM | POA: Diagnosis present

## 2019-10-14 LAB — CBC WITH DIFFERENTIAL/PLATELET
Abs Immature Granulocytes: 0.02 10*3/uL (ref 0.00–0.07)
Basophils Absolute: 0 10*3/uL (ref 0.0–0.1)
Basophils Relative: 0 %
Eosinophils Absolute: 0.2 10*3/uL (ref 0.0–0.5)
Eosinophils Relative: 4 %
HCT: 41 % (ref 39.0–52.0)
Hemoglobin: 13.9 g/dL (ref 13.0–17.0)
Immature Granulocytes: 0 %
Lymphocytes Relative: 21 %
Lymphs Abs: 1.2 10*3/uL (ref 0.7–4.0)
MCH: 28 pg (ref 26.0–34.0)
MCHC: 33.9 g/dL (ref 30.0–36.0)
MCV: 82.7 fL (ref 80.0–100.0)
Monocytes Absolute: 0.7 10*3/uL (ref 0.1–1.0)
Monocytes Relative: 12 %
Neutro Abs: 3.3 10*3/uL (ref 1.7–7.7)
Neutrophils Relative %: 63 %
Platelets: 229 10*3/uL (ref 150–400)
RBC: 4.96 MIL/uL (ref 4.22–5.81)
RDW: 12.8 % (ref 11.5–15.5)
WBC: 5.4 10*3/uL (ref 4.0–10.5)
nRBC: 0 % (ref 0.0–0.2)

## 2019-10-14 LAB — COMPREHENSIVE METABOLIC PANEL
ALT: 47 U/L — ABNORMAL HIGH (ref 0–44)
AST: 34 U/L (ref 15–41)
Albumin: 3.8 g/dL (ref 3.5–5.0)
Alkaline Phosphatase: 59 U/L (ref 38–126)
Anion gap: 12 (ref 5–15)
BUN: 11 mg/dL (ref 6–20)
CO2: 26 mmol/L (ref 22–32)
Calcium: 9 mg/dL (ref 8.9–10.3)
Chloride: 98 mmol/L (ref 98–111)
Creatinine, Ser: 1.05 mg/dL (ref 0.61–1.24)
GFR calc Af Amer: >60 mL/min (ref >60)
GFR calc non Af Amer: >60 mL/min (ref >60)
Glucose, Bld: 112 mg/dL — ABNORMAL HIGH (ref 70–99)
Potassium: 3 mmol/L — ABNORMAL LOW (ref 3.5–5.1)
Sodium: 136 mmol/L (ref 135–145)
Total Bilirubin: 1 mg/dL (ref 0.3–1.2)
Total Protein: 7.3 g/dL (ref 6.5–8.1)

## 2019-10-14 LAB — SARS CORONAVIRUS 2 BY RT PCR (HOSPITAL ORDER, PERFORMED IN ~~LOC~~ HOSPITAL LAB): SARS Coronavirus 2: NEGATIVE

## 2019-10-14 MED ORDER — LEVETIRACETAM 500 MG PO TABS
500.0000 mg | ORAL_TABLET | Freq: Two times a day (BID) | ORAL | Status: DC
  Administered 2019-10-14: 500 mg via ORAL
  Filled 2019-10-14: qty 1

## 2019-10-14 MED ORDER — DOCUSATE SODIUM 100 MG PO CAPS: 100.0000 mg | ORAL_CAPSULE | Freq: Two times a day (BID) | ORAL | Status: DC | PRN

## 2019-10-14 MED ORDER — POLYETHYLENE GLYCOL 3350 17 G PO PACK: 17.0000 g | PACK | Freq: Every day | ORAL | Status: DC | PRN

## 2019-10-14 MED ORDER — HYDROCODONE-ACETAMINOPHEN 5-325 MG PO TABS: 1.0000 | ORAL_TABLET | Freq: Four times a day (QID) | ORAL | Status: DC | PRN

## 2019-10-14 MED ORDER — ONDANSETRON HCL 4 MG/2ML IJ SOLN: 4.0000 mg | Freq: Four times a day (QID) | INTRAMUSCULAR | Status: DC | PRN

## 2019-10-14 MED ORDER — ACETAMINOPHEN 325 MG PO TABS: 650.0000 mg | ORAL_TABLET | Freq: Four times a day (QID) | ORAL | Status: DC | PRN

## 2019-10-14 MED ORDER — DEXAMETHASONE SODIUM PHOSPHATE 10 MG/ML IJ SOLN
10.0000 mg | Freq: Once | INTRAMUSCULAR | Status: AC
  Administered 2019-10-14: 10 mg via INTRAVENOUS
  Filled 2019-10-14: qty 1

## 2019-10-14 MED ORDER — SODIUM CHLORIDE 3 % IV SOLN
INTRAVENOUS | Status: DC
  Filled 2019-10-14 (×2): qty 500

## 2019-10-14 MED ORDER — FENTANYL CITRATE (PF) 100 MCG/2ML IJ SOLN
50.0000 ug | Freq: Once | INTRAMUSCULAR | Status: AC
  Administered 2019-10-14: 50 ug via INTRAVENOUS
  Filled 2019-10-14: qty 2
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
## 2019-10-14 NOTE — Progress Notes (Addendum)
Pt refusing lab draws and states he wants to go home.  Dr. Jake Samples notified

## 2019-10-14 NOTE — Progress Notes (Addendum)
Pt wanting to leave.  Risks of progressive brain swelling explained to pt.  Pt's mother called in attempt to talk him into staying for treatment, but he still wants to leave.  Dr. Jake Samples notified and talked to pt earlier at bedside.  AMA paperwork signed.  Pt is leaving now and does not want to wait on Dr. Jake Samples to come to bedside.
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
## 2019-10-16 ENCOUNTER — Inpatient Hospital Stay (HOSPITAL_COMMUNITY)
Admission: EM | Admit: 2019-10-16 | Discharge: 2019-10-17 | DRG: 950 | Discharge status: Against Medical Advice | Payer: No Typology Code available for payment source | Attending: Neurological Surgery | Admitting: Neurological Surgery

## 2019-10-16 ENCOUNTER — Emergency Department (HOSPITAL_COMMUNITY): Payer: No Typology Code available for payment source

## 2019-10-16 ENCOUNTER — Other Ambulatory Visit: Payer: Self-pay

## 2019-10-16 ENCOUNTER — Encounter (HOSPITAL_COMMUNITY): Payer: Self-pay | Admitting: Emergency Medicine

## 2019-10-16 DIAGNOSIS — R609 Edema, unspecified: Secondary | ICD-10-CM | POA: Diagnosis present

## 2019-10-16 DIAGNOSIS — Z20822 Contact with and (suspected) exposure to covid-19: Secondary | ICD-10-CM | POA: Diagnosis present

## 2019-10-16 DIAGNOSIS — S066X9D Traumatic subarachnoid hemorrhage with loss of consciousness of unspecified duration, subsequent encounter: Principal | ICD-10-CM

## 2019-10-16 DIAGNOSIS — R5381 Other malaise: Secondary | ICD-10-CM | POA: Diagnosis present

## 2019-10-16 DIAGNOSIS — R519 Headache, unspecified: Secondary | ICD-10-CM | POA: Diagnosis not present

## 2019-10-16 DIAGNOSIS — R270 Ataxia, unspecified: Secondary | ICD-10-CM | POA: Diagnosis present

## 2019-10-16 DIAGNOSIS — F17200 Nicotine dependence, unspecified, uncomplicated: Secondary | ICD-10-CM | POA: Diagnosis present

## 2019-10-16 DIAGNOSIS — S065XAA Traumatic subdural hemorrhage with loss of consciousness status unknown, initial encounter: Secondary | ICD-10-CM

## 2019-10-16 DIAGNOSIS — Z5329 Procedure and treatment not carried out because of patient's decision for other reasons: Secondary | ICD-10-CM | POA: Diagnosis not present

## 2019-10-16 DIAGNOSIS — S065X9A Traumatic subdural hemorrhage with loss of consciousness of unspecified duration, initial encounter: Secondary | ICD-10-CM

## 2019-10-16 DIAGNOSIS — S066XAA Traumatic subarachnoid hemorrhage with loss of consciousness status unknown, initial encounter: Secondary | ICD-10-CM | POA: Diagnosis present

## 2019-10-16 LAB — SODIUM, URINE, RANDOM: Sodium, Ur: 73 mmol/L

## 2019-10-16 LAB — SODIUM
Sodium: 138 mmol/L (ref 135–145)
Sodium: 137 mmol/L (ref 135–145)

## 2019-10-16 LAB — CBC WITH DIFFERENTIAL/PLATELET
Abs Immature Granulocytes: 0.02 10*3/uL (ref 0.00–0.07)
Basophils Absolute: 0 10*3/uL (ref 0.0–0.1)
Basophils Relative: 0 %
Eosinophils Absolute: 0.1 10*3/uL (ref 0.0–0.5)
Eosinophils Relative: 1 %
HCT: 43.2 % (ref 39.0–52.0)
Hemoglobin: 14.2 g/dL (ref 13.0–17.0)
Immature Granulocytes: 0 %
Lymphocytes Relative: 30 %
Lymphs Abs: 1.5 10*3/uL (ref 0.7–4.0)
MCH: 27.5 pg (ref 26.0–34.0)
MCHC: 32.9 g/dL (ref 30.0–36.0)
MCV: 83.7 fL (ref 80.0–100.0)
Monocytes Absolute: 0.7 10*3/uL (ref 0.1–1.0)
Monocytes Relative: 14 %
Neutro Abs: 2.7 10*3/uL (ref 1.7–7.7)
Neutrophils Relative %: 55 %
Platelets: 286 10*3/uL (ref 150–400)
RBC: 5.16 MIL/uL (ref 4.22–5.81)
RDW: 12.8 % (ref 11.5–15.5)
WBC: 5 10*3/uL (ref 4.0–10.5)
nRBC: 0 % (ref 0.0–0.2)

## 2019-10-16 LAB — COMPREHENSIVE METABOLIC PANEL
ALT: 42 U/L (ref 0–44)
AST: 21 U/L (ref 15–41)
Albumin: 4 g/dL (ref 3.5–5.0)
Alkaline Phosphatase: 51 U/L (ref 38–126)
Anion gap: 11 (ref 5–15)
BUN: 8 mg/dL (ref 6–20)
CO2: 25 mmol/L (ref 22–32)
Calcium: 9.1 mg/dL (ref 8.9–10.3)
Chloride: 102 mmol/L (ref 98–111)
Creatinine, Ser: 1 mg/dL (ref 0.61–1.24)
GFR calc Af Amer: >60 mL/min (ref >60)
GFR calc non Af Amer: >60 mL/min (ref >60)
Glucose, Bld: 112 mg/dL — ABNORMAL HIGH (ref 70–99)
Potassium: 3.7 mmol/L (ref 3.5–5.1)
Sodium: 138 mmol/L (ref 135–145)
Total Bilirubin: 0.7 mg/dL (ref 0.3–1.2)
Total Protein: 7.4 g/dL (ref 6.5–8.1)

## 2019-10-16 LAB — OSMOLALITY: Osmolality: 290 mOsm/kg (ref 275–295)

## 2019-10-16 LAB — SARS CORONAVIRUS 2 BY RT PCR (HOSPITAL ORDER, PERFORMED IN ~~LOC~~ HOSPITAL LAB): SARS Coronavirus 2: NEGATIVE

## 2019-10-16 LAB — OSMOLALITY, URINE: Osmolality, Ur: 768 mOsm/kg (ref 300–900)

## 2019-10-16 MED ORDER — HEPARIN SODIUM (PORCINE) 5000 UNIT/ML IJ SOLN
5000.0000 [IU] | Freq: Three times a day (TID) | INTRAMUSCULAR | Status: DC
  Administered 2019-10-16 – 2019-10-17 (×2): 5000 [IU] via SUBCUTANEOUS
  Filled 2019-10-16 (×3): qty 1

## 2019-10-16 MED ORDER — HYDROCODONE-ACETAMINOPHEN 5-325 MG PO TABS
1.0000 | ORAL_TABLET | Freq: Four times a day (QID) | ORAL | Status: DC | PRN
  Administered 2019-10-16 – 2019-10-17 (×4): 1 via ORAL
  Filled 2019-10-16 (×4): qty 1

## 2019-10-16 MED ORDER — FENTANYL CITRATE (PF) 100 MCG/2ML IJ SOLN
50.0000 ug | Freq: Once | INTRAMUSCULAR | Status: AC
  Administered 2019-10-16: 50 ug via INTRAVENOUS
  Filled 2019-10-16: qty 2

## 2019-10-16 MED ORDER — POLYETHYLENE GLYCOL 3350 17 G PO PACK: 17.0000 g | PACK | Freq: Every day | ORAL | Status: DC | PRN

## 2019-10-16 MED ORDER — FAMOTIDINE 20 MG PO TABS
20.0000 mg | ORAL_TABLET | Freq: Two times a day (BID) | ORAL | Status: DC
  Administered 2019-10-16 – 2019-10-17 (×2): 20 mg via ORAL
  Filled 2019-10-16 (×2): qty 1

## 2019-10-16 MED ORDER — LEVETIRACETAM 500 MG PO TABS
500.0000 mg | ORAL_TABLET | Freq: Two times a day (BID) | ORAL | Status: DC
  Administered 2019-10-16 – 2019-10-17 (×2): 500 mg via ORAL
  Filled 2019-10-16 (×2): qty 1

## 2019-10-16 MED ORDER — SODIUM CHLORIDE 0.9 % IV SOLN: Freq: Once | INTRAVENOUS | Status: AC

## 2019-10-16 MED ORDER — DEXAMETHASONE SODIUM PHOSPHATE 10 MG/ML IJ SOLN
8.0000 mg | Freq: Once | INTRAMUSCULAR | Status: AC
  Administered 2019-10-16: 8 mg via INTRAVENOUS
  Filled 2019-10-16: qty 1

## 2019-10-16 MED ORDER — DOCUSATE SODIUM 100 MG PO CAPS: 100.0000 mg | ORAL_CAPSULE | Freq: Two times a day (BID) | ORAL | Status: DC | PRN

## 2019-10-16 MED ORDER — ONDANSETRON HCL 4 MG PO TABS
4.0000 mg | ORAL_TABLET | Freq: Once | ORAL | Status: AC
  Administered 2019-10-16: 4 mg via ORAL
  Filled 2019-10-16: qty 1

## 2019-10-16 MED ORDER — SODIUM CHLORIDE 3 % IV SOLN
INTRAVENOUS | Status: DC
  Filled 2019-10-16 (×4): qty 500

## 2019-10-16 MED ORDER — ONDANSETRON HCL 4 MG/2ML IJ SOLN: 4.0000 mg | Freq: Four times a day (QID) | INTRAMUSCULAR | Status: DC | PRN
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
## 2019-10-16 NOTE — ED Notes (Signed)
Pt continues to pull blood pressure cuff off after being asked to leave it on.

## 2019-10-16 NOTE — Progress Notes (Signed)
Patient arrived to [facility]N[address] with the following possessions; 1x cell phone (which he has in his hands), 1x pair of nikes, 1x shirt.

## 2019-10-16 NOTE — ED Triage Notes (Signed)
Pt. Stated, [facility] been throwing up with a headache.
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
## 2019-10-16 NOTE — ED Notes (Signed)
Labs were all drawn on Sept. 12 for the same problem.

## 2019-10-17 ENCOUNTER — Inpatient Hospital Stay (HOSPITAL_COMMUNITY): Payer: No Typology Code available for payment source

## 2019-10-17 LAB — SODIUM, URINE, RANDOM: Sodium, Ur: 191 mmol/L

## 2019-10-17 LAB — SODIUM: Sodium: 137 mmol/L (ref 135–145)

## 2019-10-17 MED ORDER — CHLORHEXIDINE GLUCONATE CLOTH 2 % EX PADS: 6.0000 | MEDICATED_PAD | Freq: Every day | CUTANEOUS | Status: DC

## 2019-10-17 NOTE — Progress Notes (Signed)
This RN to bedside after lab tech voiced patient refusing labs. Pt refuses to have lab sticks. I explained these were very important d/t 3% infusing and need for continuous monitoring of Na levels. He continued to refuse, states "I know this isn't done every 6 hours and you keep sticking me anyway". I reminded him of his conversation with Dr Dawley earlier today and the importance of monitoring him here and he continued to voice "I know you think you know how to do your job, but I've been observing things the whole time and I know better". Primary RN notified.

## 2019-10-17 NOTE — Progress Notes (Signed)
Patient ID: Perry Day, male   DOB: 1985-07-18, 34 y.o.   MRN: [id number] Mr. Feinstein just left the hospital AMA. He has refused medical care as prescribed. He has refused lab tests, has argued with the nursing staff, and has expressed his desire to leave the hospital. He has left AMA on three occasions within the last week.  He is alert, follows commands such as movement. He is ambulatory. He has a resolving contusion in the right frontal and temporal lobes.  His inciting reason appears to be that he had to wait for his prn pain medication. He stated that since no medical care was being given that he need not stay. He feels the staff has been badgering him, and he has had to hold his complaints. He decided to leave against medical advice.

## 2019-10-17 NOTE — Progress Notes (Signed)
Patient needing to use bathroom, attempted to assist patient to [facility] but patient insisting to use bathroom and becoming verbally aggressive with staff. Patient not re-directable and pushing pass staff despite staff reiterating doctor recommendation that patient stay in bed. Staff assisted patient into bathroom on monitor.   Aris Lot, RN
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
## 2019-10-17 NOTE — Progress Notes (Signed)
Pt demanding to leave AMA, neurosurgeon on-call at bedside. AMA paperwork signed and in chart. IV dc'd and pt escorted off the unit.

## 2019-10-17 NOTE — Progress Notes (Addendum)
   Providing Compassionate, Quality Care - Together  NEUROSURGERY PROGRESS NOTE   S: No issues overnight. HA stable, neuro stable  O: EXAM:  BP 118/71   Pulse 62   Temp 98.2 F (36.8 C) (Oral)   Resp 16   Ht 5\' 11"  (1.803 m)   Wt 73.9 kg   SpO2 98%   BMI 22.73 kg/m   Awake, alert, oriented  Speech fluent, appropriate, less argumentative today CNs grossly intact  5/5 BUE/BLE  No drift  ASSESSMENT:  34 y.o. male with  1.  Right acute subdural hematoma 2.  Right hemorrhagic traumatic contusions with surrounding edema and mass-effect and midline shift 3.  Headaches  PLAN:  -neuro ICU -Diet -Neurochecks every hour -Keppra 500 twice daily -Hypertonic saline, 3%, sodium goal of 145-155, may need to increase titration today -DVT prophylaxis -GI prophylaxis -ct this am stable, continue close monitoring   Thank you for allowing me to participate in this patient's care.  Please do not hesitate to call with questions or concerns.   36, DO Neurosurgeon [facility] Cell: [phone]

## 2020-04-02 ENCOUNTER — Emergency Department (HOSPITAL_COMMUNITY): Payer: Self-pay

## 2020-04-02 ENCOUNTER — Emergency Department (HOSPITAL_COMMUNITY)
Admission: EM | Admit: 2020-04-02 | Discharge: 2020-04-03 | Disposition: A | Discharge status: Home / Self Care | Payer: Self-pay | Attending: Emergency Medicine | Admitting: Emergency Medicine

## 2020-04-02 ENCOUNTER — Encounter (HOSPITAL_COMMUNITY): Payer: Self-pay | Admitting: Emergency Medicine

## 2020-04-02 ENCOUNTER — Other Ambulatory Visit: Payer: Self-pay

## 2020-04-02 DIAGNOSIS — R000 Tachycardia, unspecified: Secondary | ICD-10-CM | POA: Insufficient documentation

## 2020-04-02 DIAGNOSIS — R10815 Periumbilic abdominal tenderness: Secondary | ICD-10-CM | POA: Insufficient documentation

## 2020-04-02 DIAGNOSIS — R509 Fever, unspecified: Secondary | ICD-10-CM

## 2020-04-02 DIAGNOSIS — Z20822 Contact with and (suspected) exposure to covid-19: Secondary | ICD-10-CM | POA: Insufficient documentation

## 2020-04-02 DIAGNOSIS — R112 Nausea with vomiting, unspecified: Secondary | ICD-10-CM

## 2020-04-02 DIAGNOSIS — R1084 Generalized abdominal pain: Secondary | ICD-10-CM

## 2020-04-02 DIAGNOSIS — R197 Diarrhea, unspecified: Secondary | ICD-10-CM | POA: Insufficient documentation

## 2020-04-02 DIAGNOSIS — F17200 Nicotine dependence, unspecified, uncomplicated: Secondary | ICD-10-CM | POA: Insufficient documentation

## 2020-04-02 LAB — URINALYSIS, ROUTINE W REFLEX MICROSCOPIC
Bilirubin Urine: NEGATIVE
Glucose, UA: NEGATIVE mg/dL
Hgb urine dipstick: NEGATIVE
Ketones, ur: 5 mg/dL — AB
Nitrite: NEGATIVE
Protein, ur: 30 mg/dL — AB
Specific Gravity, Urine: 1.027 (ref 1.005–1.030)
pH: 5 (ref 5.0–8.0)

## 2020-04-02 LAB — COMPREHENSIVE METABOLIC PANEL
ALT: 75 U/L — ABNORMAL HIGH (ref 0–44)
AST: 56 U/L — ABNORMAL HIGH (ref 15–41)
Albumin: 4.4 g/dL (ref 3.5–5.0)
Alkaline Phosphatase: 86 U/L (ref 38–126)
Anion gap: 15 (ref 5–15)
BUN: 11 mg/dL (ref 6–20)
CO2: 22 mmol/L (ref 22–32)
Calcium: 9.5 mg/dL (ref 8.9–10.3)
Chloride: 99 mmol/L (ref 98–111)
Creatinine, Ser: 1.34 mg/dL — ABNORMAL HIGH (ref 0.61–1.24)
GFR, Estimated: >60 mL/min (ref >60)
Glucose, Bld: 105 mg/dL — ABNORMAL HIGH (ref 70–99)
Potassium: 3.9 mmol/L (ref 3.5–5.1)
Sodium: 136 mmol/L (ref 135–145)
Total Bilirubin: 2.1 mg/dL — ABNORMAL HIGH (ref 0.3–1.2)
Total Protein: 8.6 g/dL — ABNORMAL HIGH (ref 6.5–8.1)

## 2020-04-02 LAB — LIPASE, BLOOD: Lipase: 24 U/L (ref 11–51)

## 2020-04-02 LAB — CBC
HCT: 48.9 % (ref 39.0–52.0)
Hemoglobin: 16 g/dL (ref 13.0–17.0)
MCH: 28.8 pg (ref 26.0–34.0)
MCHC: 32.7 g/dL (ref 30.0–36.0)
MCV: 88.1 fL (ref 80.0–100.0)
Platelets: 172 10*3/uL (ref 150–400)
RBC: 5.55 MIL/uL (ref 4.22–5.81)
RDW: 12.2 % (ref 11.5–15.5)
WBC: 10.9 10*3/uL — ABNORMAL HIGH (ref 4.0–10.5)
nRBC: 0 % (ref 0.0–0.2)

## 2020-04-02 MED ORDER — DICYCLOMINE HCL 10 MG/ML IM SOLN
20.0000 mg | Freq: Once | INTRAMUSCULAR | Status: AC
  Administered 2020-04-02: 20 mg via INTRAMUSCULAR
  Filled 2020-04-02: qty 2

## 2020-04-02 MED ORDER — ONDANSETRON HCL 4 MG/2ML IJ SOLN
4.0000 mg | Freq: Once | INTRAMUSCULAR | Status: AC
  Administered 2020-04-02: 4 mg via INTRAVENOUS
  Filled 2020-04-02: qty 2

## 2020-04-02 MED ORDER — ONDANSETRON 4 MG PO TBDP: 4.0000 mg | ORAL_TABLET | Freq: Three times a day (TID) | ORAL | 0 refills | Status: DC | PRN

## 2020-04-02 MED ORDER — SODIUM CHLORIDE 0.9 % IV BOLUS
1000.0000 mL | Freq: Once | INTRAVENOUS | Status: AC
  Administered 2020-04-02: 1000 mL via INTRAVENOUS

## 2020-04-02 MED ORDER — IOHEXOL 300 MG/ML  SOLN
100.0000 mL | Freq: Once | INTRAMUSCULAR | Status: AC | PRN
  Administered 2020-04-02: 100 mL via INTRAVENOUS

## 2020-04-02 MED ORDER — ACETAMINOPHEN 325 MG PO TABS
650.0000 mg | ORAL_TABLET | Freq: Once | ORAL | Status: AC
  Administered 2020-04-02: 650 mg via ORAL
  Filled 2020-04-02: qty 2

## 2020-04-02 NOTE — ED Triage Notes (Signed)
Pt c/o abdominal pain, nausea/vomiting/diarrhea after eating yesterday.
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
## 2020-04-03 LAB — SARS CORONAVIRUS 2 (TAT 6-24 HRS): SARS Coronavirus 2: NEGATIVE

## 2020-04-03 NOTE — Discharge Instructions (Signed)
Your Covid test is pending.  Isolate yourself until the test comes back.

## 2020-04-03 NOTE — ED Provider Notes (Signed)
Pt signed out by Dr. Jeraldine Loots pending CT abd/pelvis.    IMPRESSION:  Findings which may be suggestive of enteritis involving the distal  ileal loops. No loculated fluid collections or free air.    Hepatic steatosis.     Pt is feeling much better.  His Covid test is pending.  He is told to isolate until the test comes back.  He is to return if worse.    Perry Day was evaluated in Emergency Department on 04/03/2020 for the symptoms described in the history of present illness. He was evaluated in the context of the global COVID-19 pandemic, which necessitated consideration that the patient might be at risk for infection with the SARS-CoV-2 virus that causes COVID-19. Institutional protocols and algorithms that pertain to the evaluation of patients at risk for COVID-19 are in a state of rapid change based on information released by regulatory bodies including the CDC and federal and state organizations. These policies and algorithms were followed during the patient's care in the ED.   Jacalyn Lefevre, MD 04/03/20 [phone]

## 2020-04-03 NOTE — ED Notes (Signed)
E-signature pad unavailable at time of pt discharge. This RN discussed discharge materials with pt and answered all pt questions. Pt stated understanding of discharge material. ? ?

## 2020-04-07 ENCOUNTER — Ambulatory Visit (INDEPENDENT_AMBULATORY_CARE_PROVIDER_SITE_OTHER): Payer: Self-pay

## 2020-04-07 ENCOUNTER — Encounter (HOSPITAL_COMMUNITY): Payer: Self-pay

## 2020-04-07 ENCOUNTER — Other Ambulatory Visit: Payer: Self-pay

## 2020-04-07 ENCOUNTER — Ambulatory Visit (HOSPITAL_COMMUNITY)
Admission: EM | Admit: 2020-04-07 | Discharge: 2020-04-07 | Disposition: A | Discharge status: Home / Self Care | Payer: Self-pay | Attending: Family Medicine | Admitting: Family Medicine

## 2020-04-07 DIAGNOSIS — M545 Low back pain, unspecified: Secondary | ICD-10-CM

## 2020-04-07 DIAGNOSIS — M25562 Pain in left knee: Secondary | ICD-10-CM

## 2020-04-07 DIAGNOSIS — M25462 Effusion, left knee: Secondary | ICD-10-CM

## 2020-04-07 LAB — POCT URINALYSIS DIPSTICK, ED / UC
Glucose, UA: NEGATIVE mg/dL
Hgb urine dipstick: NEGATIVE
Leukocytes,Ua: NEGATIVE
Nitrite: NEGATIVE
Protein, ur: 100 mg/dL — AB
Specific Gravity, Urine: 1.02 (ref 1.005–1.030)
Urobilinogen, UA: 1 mg/dL (ref 0.0–1.0)
pH: 6.5 (ref 5.0–8.0)

## 2020-04-07 MED ORDER — PREDNISONE 20 MG PO TABS: 40.0000 mg | ORAL_TABLET | Freq: Every day | ORAL | 0 refills | Status: DC

## 2020-04-07 MED ORDER — CYCLOBENZAPRINE HCL 10 MG PO TABS: 10.0000 mg | ORAL_TABLET | Freq: Three times a day (TID) | ORAL | 0 refills | Status: DC | PRN
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
## 2020-04-07 NOTE — ED Triage Notes (Signed)
Pt presents with back pain and left knee pain X 2 days. Pt states the swelling and pain on his left knee has been getting worse. Pt denies injury.

## 2020-04-24 ENCOUNTER — Other Ambulatory Visit: Payer: Self-pay

## 2020-04-24 ENCOUNTER — Emergency Department (HOSPITAL_COMMUNITY)
Admission: EM | Admit: 2020-04-24 | Discharge: 2020-04-25 | Disposition: A | Discharge status: Home / Self Care | Payer: Self-pay | Attending: Emergency Medicine | Admitting: Emergency Medicine

## 2020-04-24 ENCOUNTER — Encounter (HOSPITAL_COMMUNITY): Payer: Self-pay | Admitting: Emergency Medicine

## 2020-04-24 ENCOUNTER — Emergency Department (HOSPITAL_COMMUNITY): Payer: Self-pay

## 2020-04-24 DIAGNOSIS — M25462 Effusion, left knee: Secondary | ICD-10-CM | POA: Insufficient documentation

## 2020-04-24 DIAGNOSIS — F17200 Nicotine dependence, unspecified, uncomplicated: Secondary | ICD-10-CM | POA: Insufficient documentation

## 2020-04-24 LAB — CBC WITH DIFFERENTIAL/PLATELET
Abs Immature Granulocytes: 0.04 10*3/uL (ref 0.00–0.07)
Basophils Absolute: 0 10*3/uL (ref 0.0–0.1)
Basophils Relative: 0 %
Eosinophils Absolute: 0 10*3/uL (ref 0.0–0.5)
Eosinophils Relative: 0 %
HCT: 36.6 % — ABNORMAL LOW (ref 39.0–52.0)
Hemoglobin: 12.4 g/dL — ABNORMAL LOW (ref 13.0–17.0)
Immature Granulocytes: 1 %
Lymphocytes Relative: 18 %
Lymphs Abs: 1.3 10*3/uL (ref 0.7–4.0)
MCH: 29.5 pg (ref 26.0–34.0)
MCHC: 33.9 g/dL (ref 30.0–36.0)
MCV: 87.1 fL (ref 80.0–100.0)
Monocytes Absolute: 0.7 10*3/uL (ref 0.1–1.0)
Monocytes Relative: 10 %
Neutro Abs: 5 10*3/uL (ref 1.7–7.7)
Neutrophils Relative %: 71 %
Platelets: 363 10*3/uL (ref 150–400)
RBC: 4.2 MIL/uL — ABNORMAL LOW (ref 4.22–5.81)
RDW: 13.1 % (ref 11.5–15.5)
WBC: 7.2 10*3/uL (ref 4.0–10.5)
nRBC: 0 % (ref 0.0–0.2)

## 2020-04-24 LAB — LACTIC ACID, PLASMA: Lactic Acid, Venous: 1 mmol/L (ref 0.5–1.9)

## 2020-04-24 LAB — COMPREHENSIVE METABOLIC PANEL
ALT: 26 U/L (ref 0–44)
AST: 27 U/L (ref 15–41)
Albumin: 3.4 g/dL — ABNORMAL LOW (ref 3.5–5.0)
Alkaline Phosphatase: 87 U/L (ref 38–126)
Anion gap: 8 (ref 5–15)
BUN: <5 mg/dL — ABNORMAL LOW (ref 6–20)
CO2: 27 mmol/L (ref 22–32)
Calcium: 8.8 mg/dL — ABNORMAL LOW (ref 8.9–10.3)
Chloride: 102 mmol/L (ref 98–111)
Creatinine, Ser: 0.77 mg/dL (ref 0.61–1.24)
GFR, Estimated: >60 mL/min (ref >60)
Glucose, Bld: 93 mg/dL (ref 70–99)
Potassium: 3.9 mmol/L (ref 3.5–5.1)
Sodium: 137 mmol/L (ref 135–145)
Total Bilirubin: 1.1 mg/dL (ref 0.3–1.2)
Total Protein: 8 g/dL (ref 6.5–8.1)

## 2020-04-24 MED ORDER — LIDOCAINE-EPINEPHRINE 1 %-1:100000 IJ SOLN
20.0000 mL | Freq: Once | INTRAMUSCULAR | Status: AC
  Administered 2020-04-24: 20 mL via INTRADERMAL
  Filled 2020-04-24: qty 1

## 2020-04-24 MED ORDER — CEPHALEXIN 250 MG PO CAPS
1000.0000 mg | ORAL_CAPSULE | Freq: Once | ORAL | Status: AC
  Administered 2020-04-24: 1000 mg via ORAL
  Filled 2020-04-24: qty 4
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
## 2020-04-24 NOTE — ED Triage Notes (Signed)
Patient complains of left knee pain and swelling, states he was seen at Urgent Care for same approximately one week ago and was told to return for further evaluation of swelling continues. Patient alert, oriented, and in no apparent distress at this time.

## 2020-04-25 ENCOUNTER — Ambulatory Visit (HOSPITAL_COMMUNITY)
Admission: RE | Admit: 2020-04-25 | Discharge: 2020-04-25 | Disposition: A | Discharge status: Home / Self Care | Payer: Self-pay | Source: Ambulatory Visit | Attending: Emergency Medicine | Admitting: Emergency Medicine

## 2020-04-25 ENCOUNTER — Other Ambulatory Visit (HOSPITAL_COMMUNITY): Payer: Self-pay | Admitting: Emergency Medicine

## 2020-04-25 DIAGNOSIS — R609 Edema, unspecified: Secondary | ICD-10-CM | POA: Insufficient documentation

## 2020-04-25 LAB — SYNOVIAL CELL COUNT + DIFF, W/ CRYSTALS
Crystals, Fluid: NONE SEEN
Eosinophils-Synovial: 0 % (ref 0–1)
Lymphocytes-Synovial Fld: 2 % (ref 0–20)
Monocyte-Macrophage-Synovial Fluid: 1 % — ABNORMAL LOW (ref 50–90)
Neutrophil, Synovial: 97 % — ABNORMAL HIGH (ref 0–25)
WBC, Synovial: 20625 /mm3 — ABNORMAL HIGH (ref 0–200)

## 2020-04-25 MED ORDER — OXYCODONE HCL 5 MG PO TABS: 5.0000 mg | ORAL_TABLET | Freq: Four times a day (QID) | ORAL | 0 refills | Status: DC | PRN

## 2020-04-25 MED ORDER — NAPROXEN 500 MG PO TABS: 500.0000 mg | ORAL_TABLET | Freq: Two times a day (BID) | ORAL | 0 refills | Status: DC

## 2020-04-25 MED ORDER — ACETAMINOPHEN 500 MG PO TABS: 1000.0000 mg | ORAL_TABLET | Freq: Three times a day (TID) | ORAL | 0 refills | Status: DC | PRN

## 2020-04-25 MED ORDER — NAPROXEN 250 MG PO TABS
500.0000 mg | ORAL_TABLET | Freq: Once | ORAL | Status: AC
  Administered 2020-04-25: 500 mg via ORAL
  Filled 2020-04-25: qty 2

## 2020-04-25 MED ORDER — CEPHALEXIN 500 MG PO CAPS: 500.0000 mg | ORAL_CAPSULE | Freq: Three times a day (TID) | ORAL | 0 refills | Status: DC

## 2020-04-25 MED FILL — CEPHALEXIN 500 MG CAPSULE: 500 | 7 days supply | Qty: 21 | Fill #0

## 2020-04-25 MED FILL — oxyCODONE HCL 5 MG TABS: 5 | 2 days supply | Qty: 8 | Fill #0

## 2020-04-25 NOTE — ED Provider Notes (Signed)
Signout from Dr. Girard Cooter and Dr. Madaline Guthrie at shift change.  Patient with left lower extremity pain, swelling.  I went and saw the patient and updated him on results.  There has been a delay in obtaining results of the joint aspiration.  Patient states that he is willing to continue to wait.  5:21 AM results returned.  Patient with cell count that suggest inflammation but not infection, white blood cell less than 50,000.  No crystals.  Plan per preceding team including order for lower extremity ultrasound later today, prescription for anti-inflammatories.  After discussion with patient, he would like something stronger for pain.  I have prescribed #8 oxycodone 5 mg tablets for acute pain in the short-term.  He is also given a prescription for Keflex to cover for cellulitis.  BP 114/84 (BP Location: Left Arm)   Pulse 82   Temp 99.1 F (37.3 C) (Oral)   Resp 18   SpO2 98%     Renne Crigler, PA-C 04/25/20 Harlow Ohms, MD 04/25/20 [phone]

## 2020-04-25 NOTE — ED Notes (Signed)
Pt discharged and ambulated out of the ED without difficulty. 

## 2020-04-25 NOTE — Discharge Instructions (Signed)
Please read and follow all provided instructions.  Your diagnoses today include:  1. Effusion of left knee     Tests performed today include:  An x-ray of the affected area - does NOT show any broken bones Blood cell counts (white, red, and platelets) Electrolytes  Kidney function test Joint fluid sample -no crystals in the joint, low concern for infection inside the joint  Vital signs. See below for your results today.   Medications prescribed:   Keflex (cephalexin) - antibiotic  You have been prescribed an antibiotic medicine: take the entire course of medicine even if you are feeling better. Stopping early can cause the antibiotic not to work.   Naproxen - anti-inflammatory pain medication  Do not exceed 500mg  naproxen every 12 hours, take with food  You have been prescribed an anti-inflammatory medication or NSAID. Take with food. Take smallest effective dose for the shortest duration needed for your pain. Stop taking if you experience stomach pain or vomiting.   Take any prescribed medications only as directed.  Home care instructions:   Follow any educational materials contained in this packet  Follow R.I.C.E. Protocol:  R - rest your injury   I  - use ice on injury without applying directly to skin  C - compress injury with bandage or splint  E - elevate the injury as much as possible  Follow-up instructions: Please return for your lower extremity ultrasound later this morning.  This will help rule out blood clot as a cause of the pain and swelling.  Please follow-up with your primary care provider or the provided orthopedic physician (bone specialist) if you continue to have significant pain in 1 week. In this case you may have a more severe injury that requires further care.   Return instructions:   Please return if your toes or feet are numb or tingling, appear gray or blue, or you have severe pain (also elevate the leg and loosen splint or wrap if you  were given one)  Please return to the Emergency Department if you experience worsening symptoms.   Please return if you have any other emergent concerns.  Additional Information:  Your vital signs today were: BP 114/84 (BP Location: Left Arm)   Pulse 82   Temp 99.1 F (37.3 C) (Oral)   Resp 18   SpO2 98%  If your blood pressure (BP) was elevated above 135/85 this visit, please have this repeated by your doctor within one month. --------------

## 2020-04-26 ENCOUNTER — Encounter (HOSPITAL_COMMUNITY): Payer: Self-pay | Admitting: Emergency Medicine

## 2020-04-26 ENCOUNTER — Emergency Department (HOSPITAL_COMMUNITY)
Admission: EM | Admit: 2020-04-26 | Discharge: 2020-04-26 | Disposition: A | Discharge status: Home / Self Care | Payer: Self-pay | Attending: Emergency Medicine | Admitting: Emergency Medicine

## 2020-04-26 ENCOUNTER — Other Ambulatory Visit (HOSPITAL_COMMUNITY): Payer: Self-pay | Admitting: Emergency Medicine

## 2020-04-26 ENCOUNTER — Ambulatory Visit (HOSPITAL_BASED_OUTPATIENT_CLINIC_OR_DEPARTMENT_OTHER)
Admission: RE | Admit: 2020-04-26 | Discharge: 2020-04-26 | Disposition: A | Discharge status: Home / Self Care | Payer: Self-pay | Source: Ambulatory Visit | Attending: Emergency Medicine | Admitting: Emergency Medicine

## 2020-04-26 DIAGNOSIS — I82462 Acute embolism and thrombosis of left calf muscular vein: Secondary | ICD-10-CM | POA: Insufficient documentation

## 2020-04-26 DIAGNOSIS — F17210 Nicotine dependence, cigarettes, uncomplicated: Secondary | ICD-10-CM | POA: Insufficient documentation

## 2020-04-26 DIAGNOSIS — R609 Edema, unspecified: Secondary | ICD-10-CM

## 2020-04-26 DIAGNOSIS — Z79899 Other long term (current) drug therapy: Secondary | ICD-10-CM | POA: Insufficient documentation

## 2020-04-26 MED ORDER — APIXABAN (ELIQUIS) VTE STARTER PACK (10MG AND 5MG): ORAL_TABLET | ORAL | 0 refills | Status: DC

## 2020-04-26 MED ORDER — RIVAROXABAN 20 MG PO TABS: 20.0000 mg | ORAL_TABLET | Freq: Every day | ORAL | Status: DC

## 2020-04-26 MED ORDER — APIXABAN 5 MG PO TABS: 10.0000 mg | ORAL_TABLET | ORAL | Status: DC

## 2020-04-26 MED ORDER — RIVAROXABAN 15 MG PO TABS
15.0000 mg | ORAL_TABLET | Freq: Two times a day (BID) | ORAL | Status: DC
  Administered 2020-04-26: 15 mg via ORAL
  Filled 2020-04-26: qty 1

## 2020-04-26 MED ORDER — RIVAROXABAN (XARELTO) VTE STARTER PACK (15 & 20 MG): ORAL_TABLET | ORAL | 0 refills | Status: DC
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
## 2020-04-26 NOTE — ED Triage Notes (Signed)
Pt states he had vascular US today that showed ? DVT.  C/o pain to L calf.

## 2020-04-26 NOTE — Discharge Instructions (Addendum)
You are diagnosed with a blood clot in your left calf.  Please follow-up with the vascular surgeon and with a primary care provider see information below   Primary Health Care  For private/commercial insurance please contact the phone number on the back of your insurance card or contact your insurance provider for covered primary health care providers in your area.  For Medicaid covered primary care please consult the physician listed on your Medicaid card. If there is no physician listed, or the physician is no longer in practice, please contact the Department of Social Services in the county your [place] card is issued from for support.    [facility]  [address] [place],  [place]  [address] Main: [phone] Fax: [phone] Mon-Fri: 8:30AM-5:30PM  [facility] is a patient-centered practice. We provide integrated care in an effort to achieve positive patient experiences and improve patient health care outcomes. Our team is here to support patients in their wellness journeys as well as to provide chronic disease management, lab testing, health education and much more.  [facility]  [address] [place], [place] [address] Phone: [phone] Mon-Fri: 8:30AM-5:30PM   We provide services to children, adolescents, adults and families living with physical, behavioral and mental health challenges.  Our multidisciplinary team delivers evidence based practices to ensure positive outcomes for our patients. We provide an array of Primary Care, Behavioral Health, and Psychiatric services to children, adolescents, adults and families.       [facility] at [facility]  [address]  [place], [place] [address] Main: [phone]  Mon-Fri: 8AM-8PM,  Saturday and Sunday 8AM to 4PM  Located in the [address] shopping plaza off [employer], we provide primary care services for families in [place]. [facility]  [address]  [place], [place] [address] Phone: [phone] Mon-Fri: 8:30AM-5:30PM   [facility] provides primary care services to adults and children 12 years and older in [place].    [facility]  Family Medicine at [facility] [address] [place], [address] Phone: [phone] Mon-Fri: 8:30am - 5:30pm  Pediatrics at [facility] [address] [place], [address] Phone: [phone] Mon-Fri: 8:30am - 5:30pm Saturdays (October - March): 9am - 1pm  Family Medicine at [facility]. [address] [place], [address] Phone: [phone] M, W & F: 8:00am - 5:00pm OUR DOCTORS   [facility] is your medical home. A medical home is a doctor's office where the staff knows you, your children, and your family. Here we know your health history, here you should feel comforted and cared for, knowing you're receiving top-notch care from professionals devoted to your well-being. [facility] offers integrated behavioral health care to all patients. In addition our advanced care teams are compromised of a Dietician, (LCSW) Licensed Visual merchandiser and a Control and instrumentation engineer.  Our aim is to address mental health and the social drivers by offering education, support and classes for patients through all walks of life.    [facility] [place] [address],  [place], [place] [address] Phone: [phone]   [facility] [place] provides basic healthcare for adults of [place] who cannot afford health insurance and do not qualify for Medicare or Medicaid.  [facility] [address],  [place], [place] [address] Phone: [phone]   We are a non-profit organization  that functions on grants, fundraisers and donations. We serve uninsured [place] residents age 68 and over that fall at or below 150% of the federal poverty guidelines.   [facility] [address] [place],  [place], [place] [address] Phone:  [phone]  It is the mission of the [facility] to recognize the right of low income, uninsured citizens of [place] to have access to health care that compassionately meets their basic medical and pharmacy needs. [facility]) [address] [place],  [place], [place] [address] Phone: [phone]  Provide health care services and medicines to uninsured patients who don't qualify for federal or private insurance and have family incomes below 200% of the Federal Poverty Level.

## 2020-04-26 NOTE — Progress Notes (Signed)
Information on my medicine - XARELTO (rivaroxaban) Patient verbalized education to me  This medication education was reviewed with me or my healthcare representative as part of my discharge preparation.  The pharmacist that spoke with me during my hospital stay was:  Joaquim Lai, [facility]  WHY WAS Perry Day PRESCRIBED FOR YOU? Xarelto was prescribed to treat blood clots that may have been found in the veins of your legs (deep vein thrombosis) or in your lungs (pulmonary embolism) and to reduce the risk of them occurring again.  What do you need to know about Xarelto? The starting dose is one 15 mg tablet taken TWICE daily with food for the FIRST 21 DAYS then on (enter date)  4/16  the dose is changed to one 20 mg tablet taken ONCE A DAY with your evening meal.  DO NOT stop taking Xarelto without talking to the health care provider who prescribed the medication.  Refill your prescription for 20 mg tablets before you run out.  After discharge, you should have regular check-up appointments with your healthcare provider that is prescribing your Xarelto.  In the future your dose may need to be changed if your kidney function changes by a significant amount.  What do you do if you miss a dose? If you are taking Xarelto TWICE DAILY and you miss a dose, take it as soon as you remember. You may take two 15 mg tablets (total 30 mg) at the same time then resume your regularly scheduled 15 mg twice daily the next day.  If you are taking Xarelto ONCE DAILY and you miss a dose, take it as soon as you remember on the same day then continue your regularly scheduled once daily regimen the next day. Do not take two doses of Xarelto at the same time.   Important Safety Information Xarelto is a blood thinner medicine that can cause bleeding. You should call your healthcare provider right away if you experience any of the following: ? Bleeding from an injury or your nose that does not stop. ? Unusual colored  urine (red or dark brown) or unusual colored stools (red or black). ? Unusual bruising for unknown reasons. ? A serious fall or if you hit your head (even if there is no bleeding).  Some medicines may interact with Xarelto and might increase your risk of bleeding while on Xarelto. To help avoid this, consult your healthcare provider or pharmacist prior to using any new prescription or non-prescription medications, including herbals, vitamins, non-steroidal anti-inflammatory drugs (NSAIDs) and supplements.  This website has more information on Xarelto: VisitDestination.com.br.

## 2020-04-26 NOTE — Progress Notes (Signed)
Lower extremity venous has been completed.   Preliminary results in CV Proc.   Blanch Media 04/26/2020 12:33 PM

## 2020-04-28 ENCOUNTER — Telehealth: Payer: Self-pay

## 2020-04-28 LAB — BODY FLUID CULTURE W GRAM STAIN: Culture: NO GROWTH

## 2020-04-28 LAB — GLUCOSE, BODY FLUID OTHER: Glucose, Body Fluid Other: <2 mg/dL

## 2020-04-28 LAB — PROTEIN, BODY FLUID (OTHER): Total Protein, Body Fluid Other: 6.2 g/dL

## 2020-04-28 NOTE — Care Management (Signed)
Patient started on Xarelto and will need to be followed up with.  EDP discussed the [facility],patient was provided 30 day free trial card.  ED CM forwarded patient's information to the Transitional Care Team at [facility].

## 2020-04-28 NOTE — Telephone Encounter (Signed)
Message received from Michel Bickers, RN CM requesting a hospital follow up appointment for the patient. Call placed to patient and scheduled him at [facility] - 05/06/2020 @ 1330. The appointment information and clinic address was then text to him as he requested

## 2020-05-06 ENCOUNTER — Encounter: Payer: Self-pay | Admitting: Family

## 2020-05-06 NOTE — Progress Notes (Signed)
Patient did not show for appointment.   

## 2021-01-10 ENCOUNTER — Other Ambulatory Visit: Payer: Self-pay

## 2021-01-10 ENCOUNTER — Emergency Department (HOSPITAL_COMMUNITY): Payer: Self-pay

## 2021-01-10 ENCOUNTER — Emergency Department (HOSPITAL_COMMUNITY)
Admission: EM | Admit: 2021-01-10 | Discharge: 2021-01-11 | Disposition: A | Discharge status: Home / Self Care | Payer: Self-pay | Attending: Physician Assistant | Admitting: Physician Assistant

## 2021-01-10 DIAGNOSIS — S01129A Laceration with foreign body of unspecified eyelid and periocular area, initial encounter: Secondary | ICD-10-CM | POA: Insufficient documentation

## 2021-01-10 DIAGNOSIS — Z5321 Procedure and treatment not carried out due to patient leaving prior to being seen by health care provider: Secondary | ICD-10-CM | POA: Insufficient documentation

## 2021-01-10 DIAGNOSIS — R451 Restlessness and agitation: Secondary | ICD-10-CM | POA: Insufficient documentation

## 2021-01-10 DIAGNOSIS — G43909 Migraine, unspecified, not intractable, without status migrainosus: Secondary | ICD-10-CM | POA: Insufficient documentation
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
## 2021-01-11 NOTE — ED Notes (Addendum)
Pt verbally aggressive in lobby to other pt and to staff. Pt states " My lyft is here and I'll be back in the morning I'm not waiting this damn long" and left

## 2021-01-12 ENCOUNTER — Emergency Department (HOSPITAL_COMMUNITY)
Admission: EM | Admit: 2021-01-12 | Discharge: 2021-01-13 | Disposition: A | Discharge status: Home / Self Care | Payer: Self-pay | Attending: Emergency Medicine | Admitting: Emergency Medicine

## 2021-01-12 ENCOUNTER — Other Ambulatory Visit: Payer: Self-pay

## 2021-01-12 ENCOUNTER — Encounter (HOSPITAL_COMMUNITY): Payer: Self-pay

## 2021-01-12 DIAGNOSIS — S0990XA Unspecified injury of head, initial encounter: Secondary | ICD-10-CM

## 2021-01-12 DIAGNOSIS — Z7901 Long term (current) use of anticoagulants: Secondary | ICD-10-CM | POA: Insufficient documentation

## 2021-01-12 DIAGNOSIS — F17210 Nicotine dependence, cigarettes, uncomplicated: Secondary | ICD-10-CM | POA: Insufficient documentation

## 2021-01-12 DIAGNOSIS — S0181XA Laceration without foreign body of other part of head, initial encounter: Secondary | ICD-10-CM | POA: Insufficient documentation

## 2021-01-12 HISTORY — DX: Acute embolism and thrombosis of unspecified deep veins of unspecified lower extremity: I82.409
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
## 2021-01-13 MED ORDER — LIDOCAINE-EPINEPHRINE (PF) 2 %-1:200000 IJ SOLN
20.0000 mL | Freq: Once | INTRAMUSCULAR | Status: AC
  Administered 2021-01-13: 20 mL
  Filled 2021-01-13: qty 20

## 2021-01-13 MED ORDER — CEPHALEXIN 500 MG PO CAPS: 500.0000 mg | ORAL_CAPSULE | Freq: Four times a day (QID) | ORAL | 0 refills | Status: AC

## 2021-01-13 NOTE — ED Notes (Signed)
Discharge instructions discussed with pt. Pt verbalized understanding with no questions at this time.  

## 2021-01-13 NOTE — Discharge Instructions (Addendum)
I am prescribing you an antibiotic called Keflex.  Please take this 4 times a day for the next 10 days.  Do not stop taking this early.  Please have your stitches removed in 7 days.  Please continue to apply antibiotic ointment to the wound 1-2 times per day.  If you develop any worsening swelling, pain, discharge from the wound, fevers, vomiting, or any generally worsening symptoms, please come back to the emergency department immediately for reevaluation.
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
## 2021-01-20 ENCOUNTER — Emergency Department (HOSPITAL_COMMUNITY)
Admission: EM | Admit: 2021-01-20 | Discharge: 2021-01-21 | Disposition: A | Discharge status: Home / Self Care | Payer: Self-pay | Attending: Emergency Medicine | Admitting: Emergency Medicine

## 2021-01-20 DIAGNOSIS — F17210 Nicotine dependence, cigarettes, uncomplicated: Secondary | ICD-10-CM | POA: Insufficient documentation

## 2021-01-20 DIAGNOSIS — Z4802 Encounter for removal of sutures: Secondary | ICD-10-CM | POA: Insufficient documentation

## 2021-01-20 DIAGNOSIS — T8131XD Disruption of external operation (surgical) wound, not elsewhere classified, subsequent encounter: Secondary | ICD-10-CM | POA: Insufficient documentation

## 2021-01-20 DIAGNOSIS — S0181XD Laceration without foreign body of other part of head, subsequent encounter: Secondary | ICD-10-CM | POA: Insufficient documentation

## 2021-01-20 DIAGNOSIS — X58XXXD Exposure to other specified factors, subsequent encounter: Secondary | ICD-10-CM | POA: Insufficient documentation

## 2021-01-21 MED ORDER — CEPHALEXIN 250 MG PO CAPS
500.0000 mg | ORAL_CAPSULE | Freq: Once | ORAL | Status: AC
  Administered 2021-01-21: 500 mg via ORAL
  Filled 2021-01-21: qty 2

## 2021-01-21 NOTE — ED Triage Notes (Signed)
Pt here for suture removal

## 2021-01-21 NOTE — ED Provider Notes (Signed)
[facility] EMERGENCY DEPARTMENT Provider Note   CSN: [id number] Arrival date & time: 01/20/21  2349     History Chief Complaint  Patient presents with   Suture / Staple Removal    Perry Day is a 35 y.o. male.  Patient here with chief complaint of needing sutures removed.  Had sutures placed approximately 7 days ago.  The initial wound was closed 48 hours after the initial injury.  There is some mild dehiscence noted to the right side.  Patient has been compliant with his antibiotics.  He requests removal of the sutures.  The history is provided by the patient. No language interpreter was used.      Past Medical History:  Diagnosis Date   DVT (deep venous thrombosis) (HCC)    H/O skin graft     Patient Active Problem List   Diagnosis Date Noted   Traumatic subarachnoid hemorrhage 10/14/2019   SDH (subdural hematoma) 10/08/2019    No past surgical history on file.     No family history on file.  Social History   Tobacco Use   Smoking status: Every Day    Packs/day: 0.50    Types: Cigarettes   Smokeless tobacco: Never  Substance Use Topics   Alcohol use: Yes    Comment: daily   Drug use: Yes    Types: Marijuana    Home Medications Prior to Admission medications   Medication Sig Start Date End Date Taking? Authorizing Provider  acetaminophen (TYLENOL) 500 MG tablet Take 2 tablets (1,000 mg total) by mouth every 8 (eight) hours as needed (pain). Patient not taking: Reported on 01/13/2021 04/25/20   Renne Crigler, PA-C  acetaminophen (TYLENOL) 500 MG tablet TAKE 2 TABLETS (1,000 MG TOTAL) BY MOUTH EVERY 8 (EIGHT) HOURS AS NEEDED (PAIN). Patient not taking: Reported on 01/13/2021 04/25/20 04/25/21  Renne Crigler, PA-C  cephALEXin (KEFLEX) 500 MG capsule Take 1 capsule (500 mg total) by mouth 4 (four) times daily for 10 days. 01/13/21 01/23/21  Placido Sou, PA-C  naproxen (NAPROSYN) 500 MG tablet TAKE 1 TABLET (500 MG TOTAL) BY  MOUTH 2 (TWO) TIMES DAILY. Patient not taking: Reported on 01/13/2021 04/25/20 04/25/21  Renne Crigler, PA-C  oxyCODONE (OXY IR/ROXICODONE) 5 MG immediate release tablet Take 1 tablet (5 mg total) by mouth every 6 (six) hours as needed for severe pain. Patient not taking: Reported on 01/13/2021 04/25/20   Renne Crigler, PA-C  RIVAROXABAN Carlena Hurl) VTE STARTER PACK (15 & 20 MG) Follow package directions: Take one 15mg  tablet by mouth twice a day. On day 22, switch to one 20mg  tablet once a day. Take with food. Patient not taking: Reported on 01/13/2021 04/26/20   01/15/2021, PA  APIXABAN 04/28/20) VTE STARTER PACK (10MG  AND 5MG ) Take as directed on package: start with two-5mg  tablets twice daily for 7 days. On day 8, switch to one-5mg  tablet twice daily. 04/26/20 04/26/20  , PA  levETIRAcetam (KEPPRA) 500 MG tablet Take 1 tablet (500 mg total) by mouth every 12 (twelve) hours for 4 days. Patient not taking: No sig reported 10/11/19 04/25/20  Dawley, 04/28/20, DO    Allergies    Patient has no known allergies.  Review of Systems   Review of Systems  All other systems reviewed and are negative.  Physical Exam Updated Vital Signs BP (!) 161/92    Pulse 88    Temp 98.1 F (36.7 C) (Oral)    Resp 16    SpO2 100%  Physical Exam Vitals and nursing note reviewed.  Constitutional:      General: He is not in acute distress.    Appearance: He is well-developed. He is not ill-appearing.  HENT:     Head: Normocephalic and atraumatic.     Comments: Mild dehiscence superficially of right side of laceration Eyes:     Conjunctiva/sclera: Conjunctivae normal.  Cardiovascular:     Rate and Rhythm: Normal rate.  Pulmonary:     Effort: Pulmonary effort is normal. No respiratory distress.  Abdominal:     General: There is no distension.  Musculoskeletal:     Cervical back: Neck supple.     Comments: Moves all extremities  Skin:    General: Skin is warm and dry.     Comments: No  erythema, purulent discharge, or sign of infection  Neurological:     Mental Status: He is alert and oriented to person, place, and time.  Psychiatric:        Mood and Affect: Mood normal.        Behavior: Behavior normal.    ED Results / Procedures / Treatments   Labs (all labs ordered are listed, but only abnormal results are displayed) Labs Reviewed - No data to display  EKG None  Radiology No results found.  Procedures .Suture Removal  Date/Time: 01/21/2021 12:10 AM Performed by: Roxy Horseman, PA-C Authorized by: Roxy Horseman, PA-C   Consent:    Consent obtained:  Verbal   Consent given by:  Patient   Risks, benefits, and alternatives were discussed: yes     Risks discussed:  Bleeding, wound separation and pain   Alternatives discussed:  No treatment Universal protocol:    Procedure explained and questions answered to patient or proxy's satisfaction: yes     Relevant documents present and verified: yes     Test results available: yes     Imaging studies available: yes     Required blood products, implants, devices, and special equipment available: yes     Site/side marked: yes     Immediately prior to procedure, a time out was called: yes     Patient identity confirmed:  Verbally with patient Location:    Location:  Head/neck   Head/neck location:  Forehead Procedure details:    Wound appearance:  No signs of infection, clean and moist   Number of sutures removed:  8 Post-procedure details:    Post-removal:  Steri-Strips applied   Procedure completion:  Tolerated well, no immediate complications   Medications Ordered in ED Medications  cephALEXin (KEFLEX) capsule 500 mg (has no administration in time range)    ED Course  I have reviewed the triage vital signs and the nursing notes.  Pertinent labs & imaging results that were available during my care of the patient were reviewed by me and considered in my medical decision making (see chart for  details).    MDM Rules/Calculators/A&P                         8 sutures were removed.  Will likely have poor cosmetic result secondary to delayed closure.  I applied Steri-Strips after removing sutures.    Final Clinical Impression(s) / ED Diagnoses Final diagnoses:  Visit for suture removal    Rx / DC Orders ED Discharge Orders     None        Roxy Horseman, PA-C 01/21/21 0010    Melene Plan, DO 01/21/21 0114

## 2021-07-30 ENCOUNTER — Encounter (HOSPITAL_COMMUNITY): Payer: Self-pay

## 2021-07-30 ENCOUNTER — Other Ambulatory Visit: Payer: Self-pay

## 2021-07-30 ENCOUNTER — Emergency Department (HOSPITAL_COMMUNITY)
Admission: EM | Admit: 2021-07-30 | Discharge: 2021-07-31 | Discharge status: Against Medical Advice | Payer: Self-pay | Attending: Emergency Medicine | Admitting: Emergency Medicine

## 2021-07-30 DIAGNOSIS — R42 Dizziness and giddiness: Secondary | ICD-10-CM | POA: Insufficient documentation

## 2021-07-30 DIAGNOSIS — F1210 Cannabis abuse, uncomplicated: Secondary | ICD-10-CM | POA: Insufficient documentation

## 2021-07-30 DIAGNOSIS — Z5321 Procedure and treatment not carried out due to patient leaving prior to being seen by health care provider: Secondary | ICD-10-CM | POA: Insufficient documentation

## 2021-07-30 DIAGNOSIS — R1110 Vomiting, unspecified: Secondary | ICD-10-CM | POA: Insufficient documentation

## 2021-07-30 DIAGNOSIS — R439 Unspecified disturbances of smell and taste: Secondary | ICD-10-CM | POA: Insufficient documentation

## 2021-07-30 DIAGNOSIS — Y900 Blood alcohol level of less than 20 mg/100 ml: Secondary | ICD-10-CM | POA: Insufficient documentation

## 2021-07-30 LAB — CBC WITH DIFFERENTIAL/PLATELET
Abs Immature Granulocytes: 0.02 10*3/uL (ref 0.00–0.07)
Basophils Absolute: 0 10*3/uL (ref 0.0–0.1)
Basophils Relative: 1 %
Eosinophils Absolute: 0.1 10*3/uL (ref 0.0–0.5)
Eosinophils Relative: 1 %
HCT: 50.3 % (ref 39.0–52.0)
Hemoglobin: 16.2 g/dL (ref 13.0–17.0)
Immature Granulocytes: 0 %
Lymphocytes Relative: 22 %
Lymphs Abs: 1.3 10*3/uL (ref 0.7–4.0)
MCH: 29.2 pg (ref 26.0–34.0)
MCHC: 32.2 g/dL (ref 30.0–36.0)
MCV: 90.8 fL (ref 80.0–100.0)
Monocytes Absolute: 0.4 10*3/uL (ref 0.1–1.0)
Monocytes Relative: 7 %
Neutro Abs: 4.2 10*3/uL (ref 1.7–7.7)
Neutrophils Relative %: 69 %
Platelets: 187 10*3/uL (ref 150–400)
RBC: 5.54 MIL/uL (ref 4.22–5.81)
RDW: 12.3 % (ref 11.5–15.5)
WBC: 6 10*3/uL (ref 4.0–10.5)
nRBC: 0 % (ref 0.0–0.2)

## 2021-07-30 LAB — URINALYSIS, ROUTINE W REFLEX MICROSCOPIC
Bacteria, UA: NONE SEEN
Bilirubin Urine: NEGATIVE
Glucose, UA: NEGATIVE mg/dL
Hgb urine dipstick: NEGATIVE
Ketones, ur: NEGATIVE mg/dL
Leukocytes,Ua: NEGATIVE
Nitrite: NEGATIVE
Protein, ur: NEGATIVE mg/dL
Specific Gravity, Urine: 1.021 (ref 1.005–1.030)
pH: 6 (ref 5.0–8.0)

## 2021-07-30 LAB — COMPREHENSIVE METABOLIC PANEL
ALT: 60 U/L — ABNORMAL HIGH (ref 0–44)
AST: 54 U/L — ABNORMAL HIGH (ref 15–41)
Albumin: 3 g/dL — ABNORMAL LOW (ref 3.5–5.0)
Alkaline Phosphatase: 55 U/L (ref 38–126)
Anion gap: 7 (ref 5–15)
BUN: 7 mg/dL (ref 6–20)
CO2: 27 mmol/L (ref 22–32)
Calcium: 8.1 mg/dL — ABNORMAL LOW (ref 8.9–10.3)
Chloride: 106 mmol/L (ref 98–111)
Creatinine, Ser: 1.13 mg/dL (ref 0.61–1.24)
GFR, Estimated: >60 mL/min (ref >60)
Glucose, Bld: 118 mg/dL — ABNORMAL HIGH (ref 70–99)
Potassium: 3.4 mmol/L — ABNORMAL LOW (ref 3.5–5.1)
Sodium: 140 mmol/L (ref 135–145)
Total Bilirubin: 1 mg/dL (ref 0.3–1.2)
Total Protein: 5 g/dL — ABNORMAL LOW (ref 6.5–8.1)

## 2021-07-30 LAB — RAPID URINE DRUG SCREEN, HOSP PERFORMED
Amphetamines: NOT DETECTED
Barbiturates: NOT DETECTED
Benzodiazepines: NOT DETECTED
Cocaine: NOT DETECTED
Opiates: NOT DETECTED
Tetrahydrocannabinol: POSITIVE — AB

## 2021-07-30 LAB — CBG MONITORING, ED: Glucose-Capillary: 102 mg/dL — ABNORMAL HIGH (ref 70–99)

## 2021-07-30 LAB — ETHANOL: Alcohol, Ethyl (B): <10 mg/dL (ref <10)

## 2021-07-30 NOTE — ED Triage Notes (Signed)
Pt reports feeling dizzy and states the atmosphere "smells weird". States the episodes last about 40 seconds, states "I just feel weird." He reports he did vomit today but states he attributes that the drinking too much beer last night. He reports he drinks "until I fall asleep" every night.

## 2021-07-30 NOTE — ED Provider Triage Note (Signed)
Emergency Medicine Provider Triage Evaluation Note  Perry Day , a 36 y.o. male  was evaluated in triage.  Pt complains of dizziness and the feeling that the atmosphere does not smell right.  Patient states "I feel weird, the atmosphere smells weird".  The patient states that he has these episodes intermittently over the past year and that they typically last 40 seconds.  Patient states he always smells the same thing when he smells the atmosphere.  Patient does endorse drinking nightly, "heavily until I fall asleep".  Denies abdominal pain, chest pain, headache  Review of Systems  Positive: Dizziness, alteration of senses Negative: Chest pain, abdominal pain, headache  Physical Exam  BP (!) 131/104 (BP Location: Right Arm)   Pulse 77   Temp 98.2 F (36.8 C) (Oral)   Resp 16   Ht 5\' 11"  (1.803 m)   Wt 72.6 kg   SpO2 100%   BMI 22.32 kg/m  Gen:   Awake, no distress   Resp:  Normal effort  MSK:   Moves extremities without difficulty  Other:    Medical Decision Making  Medically screening exam initiated at 8:55 PM.  Appropriate orders placed.  Anothy Howat was informed that the remainder of the evaluation will be completed by another provider, this initial triage assessment does not replace that evaluation, and the importance of remaining in the ED until their evaluation is complete.     Mayford Knife, PA-C 07/30/21 2057

## 2021-07-31 NOTE — ED Notes (Signed)
Patient states the wait is too long and he is leaving 

## 2021-09-01 ENCOUNTER — Other Ambulatory Visit: Payer: Self-pay

## 2021-09-01 ENCOUNTER — Emergency Department (HOSPITAL_COMMUNITY)
Admission: EM | Admit: 2021-09-01 | Discharge: 2021-09-01 | Disposition: A | Discharge status: Home / Self Care | Payer: Self-pay | Attending: Emergency Medicine | Admitting: Emergency Medicine

## 2021-09-01 ENCOUNTER — Emergency Department (HOSPITAL_COMMUNITY): Payer: Self-pay

## 2021-09-01 ENCOUNTER — Encounter (HOSPITAL_COMMUNITY): Payer: Self-pay | Admitting: Emergency Medicine

## 2021-09-01 DIAGNOSIS — R569 Unspecified convulsions: Secondary | ICD-10-CM | POA: Insufficient documentation

## 2021-09-01 DIAGNOSIS — G9389 Other specified disorders of brain: Secondary | ICD-10-CM | POA: Insufficient documentation

## 2021-09-01 DIAGNOSIS — Z7901 Long term (current) use of anticoagulants: Secondary | ICD-10-CM | POA: Insufficient documentation

## 2021-09-01 HISTORY — DX: Unspecified intracranial injury with loss of consciousness status unknown, initial encounter: S06.9XAA

## 2021-09-01 LAB — COMPREHENSIVE METABOLIC PANEL
ALT: 57 U/L — ABNORMAL HIGH (ref 0–44)
AST: 69 U/L — ABNORMAL HIGH (ref 15–41)
Albumin: 3 g/dL — ABNORMAL LOW (ref 3.5–5.0)
Alkaline Phosphatase: 66 U/L (ref 38–126)
Anion gap: 10 (ref 5–15)
BUN: 8 mg/dL (ref 6–20)
CO2: 19 mmol/L — ABNORMAL LOW (ref 22–32)
Calcium: 8 mg/dL — ABNORMAL LOW (ref 8.9–10.3)
Chloride: 109 mmol/L (ref 98–111)
Creatinine, Ser: 1.43 mg/dL — ABNORMAL HIGH (ref 0.61–1.24)
GFR, Estimated: >60 mL/min (ref >60)
Glucose, Bld: 107 mg/dL — ABNORMAL HIGH (ref 70–99)
Potassium: 3.6 mmol/L (ref 3.5–5.1)
Sodium: 138 mmol/L (ref 135–145)
Total Bilirubin: 2.2 mg/dL — ABNORMAL HIGH (ref 0.3–1.2)
Total Protein: 5.1 g/dL — ABNORMAL LOW (ref 6.5–8.1)

## 2021-09-01 LAB — CBC WITH DIFFERENTIAL/PLATELET
Abs Immature Granulocytes: 0.02 10*3/uL (ref 0.00–0.07)
Basophils Absolute: 0 10*3/uL (ref 0.0–0.1)
Basophils Relative: 1 %
Eosinophils Absolute: 0 10*3/uL (ref 0.0–0.5)
Eosinophils Relative: 0 %
HCT: 48.9 % (ref 39.0–52.0)
Hemoglobin: 16.3 g/dL (ref 13.0–17.0)
Immature Granulocytes: 0 %
Lymphocytes Relative: 15 %
Lymphs Abs: 0.8 10*3/uL (ref 0.7–4.0)
MCH: 30.1 pg (ref 26.0–34.0)
MCHC: 33.3 g/dL (ref 30.0–36.0)
MCV: 90.4 fL (ref 80.0–100.0)
Monocytes Absolute: 0.5 10*3/uL (ref 0.1–1.0)
Monocytes Relative: 9 %
Neutro Abs: 4.1 10*3/uL (ref 1.7–7.7)
Neutrophils Relative %: 75 %
Platelets: 207 10*3/uL (ref 150–400)
RBC: 5.41 MIL/uL (ref 4.22–5.81)
RDW: 12.2 % (ref 11.5–15.5)
WBC: 5.4 10*3/uL (ref 4.0–10.5)
nRBC: 0 % (ref 0.0–0.2)

## 2021-09-01 LAB — CBG MONITORING, ED: Glucose-Capillary: 112 mg/dL — ABNORMAL HIGH (ref 70–99)

## 2021-09-01 MED ORDER — LEVETIRACETAM 500 MG PO TABS: 500.0000 mg | ORAL_TABLET | Freq: Two times a day (BID) | ORAL | 0 refills | Status: DC

## 2021-09-01 MED ORDER — LEVETIRACETAM IN NACL 1000 MG/100ML IV SOLN
1000.0000 mg | Freq: Once | INTRAVENOUS | Status: AC
  Administered 2021-09-01: 1000 mg via INTRAVENOUS
  Filled 2021-09-01: qty 100

## 2021-09-01 MED ORDER — SODIUM CHLORIDE 0.9 % IV BOLUS
1000.0000 mL | Freq: Once | INTRAVENOUS | Status: AC
  Administered 2021-09-01: 1000 mL via INTRAVENOUS

## 2021-09-01 NOTE — ED Notes (Signed)
Patient verbalizes understanding of discharge instructions. Opportunity for questioning and answers were provided. Armband removed by staff, pt discharged from ED. Pt ambulatory to ED waiting room with steady gait.  

## 2021-09-01 NOTE — ED Notes (Signed)
Seizure pads placed on bed, suction and O2 equipment at bedside.

## 2021-09-01 NOTE — Discharge Instructions (Signed)
As we discussed, you likely had a seizure.  Please take keppra 500 mg twice daily   I recommend you do not drive until you are cleared by neurology  Please see neurology for follow-up  Return to ER if you have another seizure, lethargy

## 2021-09-01 NOTE — ED Triage Notes (Signed)
Pt BIB EMS from home for seizure witnessed by family pt stated he smelled something weird and nauseous and fell into floor and had possible tonic-clonic seizure lasting approx. 3 minutes per family. Pt currently A&Ox4. 

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
## 2021-09-11 ENCOUNTER — Encounter (HOSPITAL_COMMUNITY): Payer: Self-pay | Admitting: Emergency Medicine

## 2021-09-11 ENCOUNTER — Other Ambulatory Visit: Payer: Self-pay

## 2021-09-11 ENCOUNTER — Emergency Department (HOSPITAL_COMMUNITY): Payer: Self-pay

## 2021-09-11 ENCOUNTER — Emergency Department (HOSPITAL_COMMUNITY)
Admission: EM | Admit: 2021-09-11 | Discharge: 2021-09-11 | Disposition: A | Discharge status: Home / Self Care | Payer: Self-pay | Attending: Emergency Medicine | Admitting: Emergency Medicine

## 2021-09-11 DIAGNOSIS — R0789 Other chest pain: Secondary | ICD-10-CM | POA: Insufficient documentation

## 2021-09-11 DIAGNOSIS — M25562 Pain in left knee: Secondary | ICD-10-CM | POA: Insufficient documentation

## 2021-09-11 DIAGNOSIS — M25561 Pain in right knee: Secondary | ICD-10-CM | POA: Insufficient documentation

## 2021-09-11 DIAGNOSIS — Z23 Encounter for immunization: Secondary | ICD-10-CM | POA: Insufficient documentation

## 2021-09-11 DIAGNOSIS — S022XXA Fracture of nasal bones, initial encounter for closed fracture: Secondary | ICD-10-CM | POA: Insufficient documentation

## 2021-09-11 DIAGNOSIS — R519 Headache, unspecified: Secondary | ICD-10-CM | POA: Insufficient documentation

## 2021-09-11 DIAGNOSIS — Z7901 Long term (current) use of anticoagulants: Secondary | ICD-10-CM | POA: Insufficient documentation

## 2021-09-11 DIAGNOSIS — W01198A Fall on same level from slipping, tripping and stumbling with subsequent striking against other object, initial encounter: Secondary | ICD-10-CM | POA: Insufficient documentation

## 2021-09-11 DIAGNOSIS — S62661A Nondisplaced fracture of distal phalanx of left index finger, initial encounter for closed fracture: Secondary | ICD-10-CM | POA: Insufficient documentation

## 2021-09-11 DIAGNOSIS — W19XXXA Unspecified fall, initial encounter: Secondary | ICD-10-CM

## 2021-09-11 DIAGNOSIS — M542 Cervicalgia: Secondary | ICD-10-CM | POA: Insufficient documentation

## 2021-09-11 HISTORY — DX: Unspecified convulsions: R56.9

## 2021-09-11 MED ORDER — LEVETIRACETAM 500 MG PO TABS
500.0000 mg | ORAL_TABLET | Freq: Once | ORAL | Status: AC
  Administered 2021-09-11: 500 mg via ORAL
  Filled 2021-09-11: qty 1

## 2021-09-11 MED ORDER — TETANUS-DIPHTH-ACELL PERTUSSIS 5-2.5-18.5 LF-MCG/0.5 IM SUSY
0.5000 mL | PREFILLED_SYRINGE | Freq: Once | INTRAMUSCULAR | Status: AC
  Administered 2021-09-11: 0.5 mL via INTRAMUSCULAR
  Filled 2021-09-11: qty 0.5
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
## 2021-09-11 NOTE — Discharge Instructions (Addendum)
You were seen in the emergency department after a fall. Your imaging showed that you have a fracture to your left second finger, please keep the splint in place at all times until you are able to follow-up with hand surgery, please call to make an appointment.  Your CT scan also showed a nondisplaced fracture of your nose,-follow attached instructions.  Do not blow your nose.  Follow-up with your nose and throat.  Please call to make an appointment.  Follow attached wound care instructions.  Take Tylenol as needed for pain, please take this per over-the-counter dosing.  Follow-up with your primary care provider within 3 days for recheck of your symptoms.  Return to the emergency department for any new or worsening symptoms including but not limited to new or worsening pain, chest pain, trouble breathing, coughing up blood, blood in your urine/stool, trouble walking, passing out, seizure activity, or any other concerns.

## 2021-09-11 NOTE — ED Triage Notes (Signed)
Patient arrived with [employer] officers handcuffed , fell this evening , presents with skin abrasions at bilateral knees / right forehead . No LOC . Alert and oriented .

## 2021-11-23 ENCOUNTER — Other Ambulatory Visit: Payer: Self-pay

## 2021-11-23 ENCOUNTER — Emergency Department (HOSPITAL_COMMUNITY)
Admission: EM | Admit: 2021-11-23 | Discharge: 2021-11-23 | Disposition: A | Discharge status: Home / Self Care | Payer: Self-pay | Attending: Emergency Medicine | Admitting: Emergency Medicine

## 2021-11-23 ENCOUNTER — Emergency Department (HOSPITAL_COMMUNITY): Payer: Self-pay

## 2021-11-23 DIAGNOSIS — D72819 Decreased white blood cell count, unspecified: Secondary | ICD-10-CM | POA: Insufficient documentation

## 2021-11-23 DIAGNOSIS — R569 Unspecified convulsions: Secondary | ICD-10-CM | POA: Insufficient documentation

## 2021-11-23 DIAGNOSIS — J690 Pneumonitis due to inhalation of food and vomit: Secondary | ICD-10-CM | POA: Insufficient documentation

## 2021-11-23 DIAGNOSIS — Z7901 Long term (current) use of anticoagulants: Secondary | ICD-10-CM | POA: Insufficient documentation

## 2021-11-23 LAB — CBC WITH DIFFERENTIAL/PLATELET
Abs Immature Granulocytes: 0.04 10*3/uL (ref 0.00–0.07)
Basophils Absolute: 0 10*3/uL (ref 0.0–0.1)
Basophils Relative: 1 %
Eosinophils Absolute: 0.1 10*3/uL (ref 0.0–0.5)
Eosinophils Relative: 2 %
HCT: 47.8 % (ref 39.0–52.0)
Hemoglobin: 16.4 g/dL (ref 13.0–17.0)
Immature Granulocytes: 1 %
Lymphocytes Relative: 25 %
Lymphs Abs: 0.8 10*3/uL (ref 0.7–4.0)
MCH: 30.5 pg (ref 26.0–34.0)
MCHC: 34.3 g/dL (ref 30.0–36.0)
MCV: 89 fL (ref 80.0–100.0)
Monocytes Absolute: 0.3 10*3/uL (ref 0.1–1.0)
Monocytes Relative: 10 %
Neutro Abs: 2.1 10*3/uL (ref 1.7–7.7)
Neutrophils Relative %: 61 %
Platelets: 195 10*3/uL (ref 150–400)
RBC: 5.37 MIL/uL (ref 4.22–5.81)
RDW: 12.6 % (ref 11.5–15.5)
WBC: 3.4 10*3/uL — ABNORMAL LOW (ref 4.0–10.5)
nRBC: 0 % (ref 0.0–0.2)

## 2021-11-23 LAB — COMPREHENSIVE METABOLIC PANEL
ALT: 80 U/L — ABNORMAL HIGH (ref 0–44)
AST: 119 U/L — ABNORMAL HIGH (ref 15–41)
Albumin: 3.4 g/dL — ABNORMAL LOW (ref 3.5–5.0)
Alkaline Phosphatase: 108 U/L (ref 38–126)
Anion gap: 18 — ABNORMAL HIGH (ref 5–15)
BUN: <5 mg/dL — ABNORMAL LOW (ref 6–20)
CO2: 20 mmol/L — ABNORMAL LOW (ref 22–32)
Calcium: 8.7 mg/dL — ABNORMAL LOW (ref 8.9–10.3)
Chloride: 101 mmol/L (ref 98–111)
Creatinine, Ser: 1.24 mg/dL (ref 0.61–1.24)
GFR, Estimated: >60 mL/min (ref >60)
Glucose, Bld: 118 mg/dL — ABNORMAL HIGH (ref 70–99)
Potassium: 4.1 mmol/L (ref 3.5–5.1)
Sodium: 139 mmol/L (ref 135–145)
Total Bilirubin: 2.7 mg/dL — ABNORMAL HIGH (ref 0.3–1.2)
Total Protein: 6 g/dL — ABNORMAL LOW (ref 6.5–8.1)

## 2021-11-23 LAB — MAGNESIUM: Magnesium: 1.8 mg/dL (ref 1.7–2.4)

## 2021-11-23 LAB — D-DIMER, QUANTITATIVE: D-Dimer, Quant: 3.79 ug/mL-FEU — ABNORMAL HIGH (ref 0.00–0.50)

## 2021-11-23 LAB — ETHANOL: Alcohol, Ethyl (B): <10 mg/dL (ref <10)

## 2021-11-23 MED ORDER — LACTATED RINGERS IV BOLUS
1000.0000 mL | Freq: Once | INTRAVENOUS | Status: AC
  Administered 2021-11-23: 1000 mL via INTRAVENOUS

## 2021-11-23 MED ORDER — IOHEXOL 350 MG/ML SOLN
75.0000 mL | Freq: Once | INTRAVENOUS | Status: AC | PRN
  Administered 2021-11-23: 75 mL via INTRAVENOUS

## 2021-11-23 MED ORDER — LEVETIRACETAM IN NACL 1000 MG/100ML IV SOLN
1000.0000 mg | Freq: Once | INTRAVENOUS | Status: AC
  Administered 2021-11-23: 1000 mg via INTRAVENOUS
  Filled 2021-11-23: qty 100

## 2021-11-23 MED ORDER — LEVETIRACETAM 500 MG PO TABS: 500.0000 mg | ORAL_TABLET | Freq: Two times a day (BID) | ORAL | 2 refills | Status: DC

## 2021-11-23 MED ORDER — AMOXICILLIN-POT CLAVULANATE 875-125 MG PO TABS: 1.0000 | ORAL_TABLET | Freq: Two times a day (BID) | ORAL | 0 refills | Status: DC

## 2021-11-23 NOTE — Discharge Instructions (Addendum)
You have been seen in the emergency department today for a likely seizure.  Your workup today including labs did not reveal a definite cause of your seizures.  Please followup with your doctor as soon as possible, ideally within the week, regarding today's emergency department visit and your likely seizure.  You will also need to follow up with a neurologist as soon as possible, please call the number listed in your discharge paperwork to schedule a follow-up appointment as soon as possible with [facility] neurology.   As we have discussed, it is very important that you DO NOT DRIVE until you have been seen and cleared by your neurologist. Remember to get plenty of sleep and avoid any alcohol or drug use.  Continue to take the seizure medication Keppra as prescribed.  Your CT scan of your chest showed no blood clots.  If you develop fevers or cough with sputum, you can take the antibiotics as prescribed for pneumonia.  Return to the emergency department if you have any further seizures, develop any weakness/numbness of any arm/leg, confusion, slurred speech, fever > 100.4, neck pain/stiffness or sudden/severe headache.

## 2021-11-23 NOTE — ED Notes (Signed)
Patient transported to CT 
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
## 2021-11-25 LAB — LEVETIRACETAM LEVEL: Levetiracetam Lvl: <2 ug/mL — ABNORMAL LOW (ref 10.0–40.0)

## 2022-01-05 ENCOUNTER — Emergency Department (HOSPITAL_COMMUNITY): Payer: Medicaid Other

## 2022-01-05 ENCOUNTER — Encounter (HOSPITAL_COMMUNITY): Payer: Self-pay

## 2022-01-05 ENCOUNTER — Other Ambulatory Visit: Payer: Self-pay

## 2022-01-05 ENCOUNTER — Inpatient Hospital Stay (HOSPITAL_COMMUNITY)
Admission: EM | Admit: 2022-01-05 | Discharge: 2022-01-07 | DRG: 914 | Disposition: A | Discharge status: Home / Self Care | Payer: Medicaid Other | Attending: General Surgery | Admitting: General Surgery

## 2022-01-05 DIAGNOSIS — S41112A Laceration without foreign body of left upper arm, initial encounter: Principal | ICD-10-CM | POA: Diagnosis present

## 2022-01-05 DIAGNOSIS — T148XXA Other injury of unspecified body region, initial encounter: Secondary | ICD-10-CM | POA: Diagnosis present

## 2022-01-05 DIAGNOSIS — Z87898 Personal history of other specified conditions: Secondary | ICD-10-CM

## 2022-01-05 DIAGNOSIS — Z87820 Personal history of traumatic brain injury: Secondary | ICD-10-CM

## 2022-01-05 DIAGNOSIS — Z79899 Other long term (current) drug therapy: Secondary | ICD-10-CM

## 2022-01-05 DIAGNOSIS — S31812A Laceration with foreign body of right buttock, initial encounter: Secondary | ICD-10-CM | POA: Diagnosis present

## 2022-01-05 DIAGNOSIS — Y929 Unspecified place or not applicable: Secondary | ICD-10-CM

## 2022-01-05 DIAGNOSIS — F1090 Alcohol use, unspecified, uncomplicated: Secondary | ICD-10-CM | POA: Diagnosis present

## 2022-01-05 DIAGNOSIS — Z86718 Personal history of other venous thrombosis and embolism: Secondary | ICD-10-CM

## 2022-01-05 DIAGNOSIS — R569 Unspecified convulsions: Secondary | ICD-10-CM | POA: Diagnosis present

## 2022-01-05 DIAGNOSIS — D62 Acute posthemorrhagic anemia: Secondary | ICD-10-CM | POA: Diagnosis present

## 2022-01-05 DIAGNOSIS — Y908 Blood alcohol level of 240 mg/100 ml or more: Secondary | ICD-10-CM | POA: Diagnosis present

## 2022-01-05 DIAGNOSIS — S31020A Laceration with foreign body of lower back and pelvis without penetration into retroperitoneum, initial encounter: Secondary | ICD-10-CM | POA: Diagnosis present

## 2022-01-05 DIAGNOSIS — S41022A Laceration with foreign body of left shoulder, initial encounter: Principal | ICD-10-CM | POA: Diagnosis present

## 2022-01-05 HISTORY — DX: Unspecified convulsions: R56.9

## 2022-01-05 LAB — I-STAT CHEM 8, ED
BUN: <3 mg/dL — ABNORMAL LOW (ref 6–20)
Calcium, Ion: 0.99 mmol/L — ABNORMAL LOW (ref 1.15–1.40)
Chloride: 106 mmol/L (ref 98–111)
Creatinine, Ser: 1.3 mg/dL — ABNORMAL HIGH (ref 0.61–1.24)
Glucose, Bld: 101 mg/dL — ABNORMAL HIGH (ref 70–99)
HCT: 46 % (ref 39.0–52.0)
Hemoglobin: 15.6 g/dL (ref 13.0–17.0)
Potassium: 3.2 mmol/L — ABNORMAL LOW (ref 3.5–5.1)
Sodium: 147 mmol/L — ABNORMAL HIGH (ref 135–145)
TCO2: 25 mmol/L (ref 22–32)

## 2022-01-05 LAB — SAMPLE TO BLOOD BANK

## 2022-01-05 LAB — CBC
HCT: 46.3 % (ref 39.0–52.0)
Hemoglobin: 15.4 g/dL (ref 13.0–17.0)
MCH: 29.7 pg (ref 26.0–34.0)
MCHC: 33.3 g/dL (ref 30.0–36.0)
MCV: 89.2 fL (ref 80.0–100.0)
Platelets: 361 10*3/uL (ref 150–400)
RBC: 5.19 MIL/uL (ref 4.22–5.81)
RDW: 13.2 % (ref 11.5–15.5)
WBC: 5.2 10*3/uL (ref 4.0–10.5)
nRBC: 0 % (ref 0.0–0.2)

## 2022-01-05 LAB — PROTIME-INR
INR: 1 (ref 0.8–1.2)
Prothrombin Time: 13.1 seconds (ref 11.4–15.2)

## 2022-01-05 MED ORDER — LACTATED RINGERS IV BOLUS
1000.0000 mL | Freq: Once | INTRAVENOUS | Status: AC
  Administered 2022-01-05: 1000 mL via INTRAVENOUS

## 2022-01-05 MED ORDER — CEFAZOLIN SODIUM-DEXTROSE 2-4 GM/100ML-% IV SOLN
2.0000 g | INTRAVENOUS | Status: AC
  Administered 2022-01-06: 2 g via INTRAVENOUS
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
## 2022-01-06 ENCOUNTER — Emergency Department (HOSPITAL_BASED_OUTPATIENT_CLINIC_OR_DEPARTMENT_OTHER): Payer: Medicaid Other | Admitting: Anesthesiology

## 2022-01-06 ENCOUNTER — Other Ambulatory Visit (HOSPITAL_COMMUNITY): Payer: Self-pay

## 2022-01-06 ENCOUNTER — Encounter (HOSPITAL_COMMUNITY): Admission: EM | Disposition: A | Discharge status: Home / Self Care | Payer: Self-pay | Source: Home / Self Care

## 2022-01-06 ENCOUNTER — Emergency Department (HOSPITAL_COMMUNITY): Payer: Medicaid Other | Admitting: Anesthesiology

## 2022-01-06 ENCOUNTER — Encounter (HOSPITAL_COMMUNITY): Payer: Self-pay | Admitting: Certified Registered Nurse Anesthetist

## 2022-01-06 DIAGNOSIS — T148XXA Other injury of unspecified body region, initial encounter: Secondary | ICD-10-CM | POA: Diagnosis present

## 2022-01-06 DIAGNOSIS — S41112A Laceration without foreign body of left upper arm, initial encounter: Principal | ICD-10-CM | POA: Diagnosis present

## 2022-01-06 DIAGNOSIS — Y929 Unspecified place or not applicable: Secondary | ICD-10-CM | POA: Diagnosis not present

## 2022-01-06 DIAGNOSIS — S41012A Laceration without foreign body of left shoulder, initial encounter: Secondary | ICD-10-CM | POA: Diagnosis not present

## 2022-01-06 DIAGNOSIS — Z87820 Personal history of traumatic brain injury: Secondary | ICD-10-CM | POA: Diagnosis not present

## 2022-01-06 DIAGNOSIS — Z79899 Other long term (current) drug therapy: Secondary | ICD-10-CM | POA: Diagnosis not present

## 2022-01-06 DIAGNOSIS — F1090 Alcohol use, unspecified, uncomplicated: Secondary | ICD-10-CM | POA: Diagnosis present

## 2022-01-06 DIAGNOSIS — S31812A Laceration with foreign body of right buttock, initial encounter: Secondary | ICD-10-CM | POA: Diagnosis present

## 2022-01-06 DIAGNOSIS — S31020A Laceration with foreign body of lower back and pelvis without penetration into retroperitoneum, initial encounter: Secondary | ICD-10-CM | POA: Diagnosis present

## 2022-01-06 DIAGNOSIS — R569 Unspecified convulsions: Secondary | ICD-10-CM | POA: Diagnosis present

## 2022-01-06 DIAGNOSIS — S41022A Laceration with foreign body of left shoulder, initial encounter: Secondary | ICD-10-CM | POA: Diagnosis present

## 2022-01-06 DIAGNOSIS — Z86718 Personal history of other venous thrombosis and embolism: Secondary | ICD-10-CM | POA: Diagnosis not present

## 2022-01-06 DIAGNOSIS — D62 Acute posthemorrhagic anemia: Secondary | ICD-10-CM | POA: Diagnosis present

## 2022-01-06 DIAGNOSIS — Y908 Blood alcohol level of 240 mg/100 ml or more: Secondary | ICD-10-CM | POA: Diagnosis present

## 2022-01-06 HISTORY — PX: WOUND EXPLORATION: SHX6188

## 2022-01-06 HISTORY — PX: I & D EXTREMITY: SHX5045

## 2022-01-06 LAB — COMPREHENSIVE METABOLIC PANEL
ALT: 51 U/L — ABNORMAL HIGH (ref 0–44)
AST: 49 U/L — ABNORMAL HIGH (ref 15–41)
Albumin: 3.1 g/dL — ABNORMAL LOW (ref 3.5–5.0)
Alkaline Phosphatase: 71 U/L (ref 38–126)
Anion gap: 11 (ref 5–15)
BUN: <5 mg/dL — ABNORMAL LOW (ref 6–20)
CO2: 25 mmol/L (ref 22–32)
Calcium: 7.9 mg/dL — ABNORMAL LOW (ref 8.9–10.3)
Chloride: 109 mmol/L (ref 98–111)
Creatinine, Ser: 0.99 mg/dL (ref 0.61–1.24)
GFR, Estimated: >60 mL/min (ref >60)
Glucose, Bld: 103 mg/dL — ABNORMAL HIGH (ref 70–99)
Potassium: 3.2 mmol/L — ABNORMAL LOW (ref 3.5–5.1)
Sodium: 145 mmol/L (ref 135–145)
Total Bilirubin: 0.5 mg/dL (ref 0.3–1.2)
Total Protein: 6 g/dL — ABNORMAL LOW (ref 6.5–8.1)

## 2022-01-06 LAB — ETHANOL: Alcohol, Ethyl (B): 322 mg/dL (ref <10)

## 2022-01-06 LAB — CBC
HCT: 28.5 % — ABNORMAL LOW (ref 39.0–52.0)
Hemoglobin: 9.9 g/dL — ABNORMAL LOW (ref 13.0–17.0)
MCH: 30.8 pg (ref 26.0–34.0)
MCHC: 34.7 g/dL (ref 30.0–36.0)
MCV: 88.8 fL (ref 80.0–100.0)
Platelets: 230 10*3/uL (ref 150–400)
RBC: 3.21 MIL/uL — ABNORMAL LOW (ref 4.22–5.81)
RDW: 13.3 % (ref 11.5–15.5)
WBC: 7.4 10*3/uL (ref 4.0–10.5)
nRBC: 0 % (ref 0.0–0.2)

## 2022-01-06 LAB — BASIC METABOLIC PANEL
Anion gap: 16 — ABNORMAL HIGH (ref 5–15)
BUN: <5 mg/dL — ABNORMAL LOW (ref 6–20)
CO2: 26 mmol/L (ref 22–32)
Calcium: 7.8 mg/dL — ABNORMAL LOW (ref 8.9–10.3)
Chloride: 107 mmol/L (ref 98–111)
Creatinine, Ser: 0.89 mg/dL (ref 0.61–1.24)
GFR, Estimated: >60 mL/min (ref >60)
Glucose, Bld: 94 mg/dL (ref 70–99)
Potassium: 3.5 mmol/L (ref 3.5–5.1)
Sodium: 149 mmol/L — ABNORMAL HIGH (ref 135–145)

## 2022-01-06 LAB — HIV ANTIBODY (ROUTINE TESTING W REFLEX): HIV Screen 4th Generation wRfx: NONREACTIVE

## 2022-01-06 LAB — GLUCOSE, CAPILLARY: Glucose-Capillary: 122 mg/dL — ABNORMAL HIGH (ref 70–99)

## 2022-01-06 LAB — LACTIC ACID, PLASMA: Lactic Acid, Venous: 2.8 mmol/L (ref 0.5–1.9)

## 2022-01-06 SURGERY — WOUND EXPLORATION
Anesthesia: General | Site: Shoulder | Laterality: Left

## 2022-01-06 MED ORDER — ONDANSETRON HCL 4 MG/2ML IJ SOLN: 4.0000 mg | Freq: Four times a day (QID) | INTRAMUSCULAR | Status: DC | PRN

## 2022-01-06 MED ORDER — ONDANSETRON HCL 4 MG/2ML IJ SOLN
INTRAMUSCULAR | Status: DC | PRN
  Administered 2022-01-06: 4 mg via INTRAVENOUS

## 2022-01-06 MED ORDER — SUGAMMADEX SODIUM 200 MG/2ML IV SOLN
INTRAVENOUS | Status: DC | PRN
  Administered 2022-01-06: 200 mg via INTRAVENOUS

## 2022-01-06 MED ORDER — OXYCODONE HCL 5 MG PO TABS: 5.0000 mg | ORAL_TABLET | Freq: Once | ORAL | Status: DC | PRN

## 2022-01-06 MED ORDER — MEPERIDINE HCL 25 MG/ML IJ SOLN: 6.2500 mg | INTRAMUSCULAR | Status: DC | PRN

## 2022-01-06 MED ORDER — CEFAZOLIN SODIUM 1 G IJ SOLR
INTRAMUSCULAR | Status: AC
  Filled 2022-01-06: qty 20

## 2022-01-06 MED ORDER — OXYCODONE HCL 5 MG/5ML PO SOLN: 5.0000 mg | Freq: Once | ORAL | Status: DC | PRN

## 2022-01-06 MED ORDER — SODIUM CHLORIDE 0.9 % IV SOLN: INTRAVENOUS | Status: DC

## 2022-01-06 MED ORDER — HYDROMORPHONE HCL 1 MG/ML IJ SOLN
0.5000 mg | INTRAMUSCULAR | Status: DC | PRN
  Administered 2022-01-06: 0.5 mg via INTRAVENOUS
  Filled 2022-01-06: qty 0.5

## 2022-01-06 MED ORDER — SPIRITUS FRUMENTI
1.0000 | Freq: Every day | ORAL | Status: DC
  Administered 2022-01-06: 1 via ORAL
  Filled 2022-01-06 (×2): qty 1

## 2022-01-06 MED ORDER — PROPOFOL 10 MG/ML IV BOLUS
INTRAVENOUS | Status: DC | PRN
  Administered 2022-01-06: 200 mg via INTRAVENOUS

## 2022-01-06 MED ORDER — BISACODYL 10 MG RE SUPP: 10.0000 mg | Freq: Every day | RECTAL | Status: DC | PRN

## 2022-01-06 MED ORDER — PROMETHAZINE HCL 25 MG/ML IJ SOLN: 6.2500 mg | INTRAMUSCULAR | Status: DC | PRN

## 2022-01-06 MED ORDER — MIDAZOLAM HCL 2 MG/2ML IJ SOLN
2.0000 mg | Freq: Once | INTRAMUSCULAR | Status: AC
  Administered 2022-01-06: 2 mg via INTRAVENOUS

## 2022-01-06 MED ORDER — ACETAMINOPHEN 500 MG PO TABS
1000.0000 mg | ORAL_TABLET | Freq: Four times a day (QID) | ORAL | Status: DC
  Administered 2022-01-06 – 2022-01-07 (×5): 1000 mg via ORAL
  Filled 2022-01-06 (×5): qty 2

## 2022-01-06 MED ORDER — METHOCARBAMOL 500 MG PO TABS
500.0000 mg | ORAL_TABLET | Freq: Three times a day (TID) | ORAL | Status: DC | PRN
  Administered 2022-01-06: 500 mg via ORAL
  Filled 2022-01-06: qty 1

## 2022-01-06 MED ORDER — FOLIC ACID 1 MG PO TABS
1.0000 mg | ORAL_TABLET | Freq: Every day | ORAL | Status: DC
  Administered 2022-01-06 – 2022-01-07 (×2): 1 mg via ORAL
  Filled 2022-01-06 (×2): qty 1

## 2022-01-06 MED ORDER — LIDOCAINE HCL (CARDIAC) PF 100 MG/5ML IV SOSY
PREFILLED_SYRINGE | INTRAVENOUS | Status: DC | PRN
  Administered 2022-01-06: 40 mg via INTRAVENOUS

## 2022-01-06 MED ORDER — HYDROMORPHONE HCL 1 MG/ML IJ SOLN
0.5000 mg | INTRAMUSCULAR | Status: DC | PRN
  Administered 2022-01-07: 0.5 mg via INTRAVENOUS
  Filled 2022-01-06: qty 0.5

## 2022-01-06 MED ORDER — ALBUMIN HUMAN 5 % IV SOLN: INTRAVENOUS | Status: DC | PRN

## 2022-01-06 MED ORDER — METHOCARBAMOL 1000 MG/10ML IJ SOLN: 500.0000 mg | Freq: Three times a day (TID) | INTRAVENOUS | Status: DC | PRN

## 2022-01-06 MED ORDER — LEVETIRACETAM 500 MG PO TABS
500.0000 mg | ORAL_TABLET | Freq: Two times a day (BID) | ORAL | 0 refills | Status: DC
  Filled 2022-01-06: qty 60, 30d supply, fill #0

## 2022-01-06 MED ORDER — DOCUSATE SODIUM 100 MG PO CAPS
100.0000 mg | ORAL_CAPSULE | Freq: Two times a day (BID) | ORAL | Status: DC
  Administered 2022-01-06 – 2022-01-07 (×3): 100 mg via ORAL
  Filled 2022-01-06 (×3): qty 1

## 2022-01-06 MED ORDER — HYDROMORPHONE HCL 1 MG/ML IJ SOLN
INTRAMUSCULAR | Status: AC
  Filled 2022-01-06: qty 1

## 2022-01-06 MED ORDER — MIDAZOLAM HCL 2 MG/2ML IJ SOLN
0.5000 mg | Freq: Once | INTRAMUSCULAR | Status: AC | PRN
  Administered 2022-01-06: 2 mg via INTRAVENOUS

## 2022-01-06 MED ORDER — THIAMINE HCL 100 MG/ML IJ SOLN: 100.0000 mg | Freq: Every day | INTRAMUSCULAR | Status: DC

## 2022-01-06 MED ORDER — LEVETIRACETAM IN NACL 500 MG/100ML IV SOLN
500.0000 mg | Freq: Two times a day (BID) | INTRAVENOUS | Status: DC
  Administered 2022-01-06: 500 mg via INTRAVENOUS
  Filled 2022-01-06 (×2): qty 100

## 2022-01-06 MED ORDER — ONDANSETRON 4 MG PO TBDP
4.0000 mg | ORAL_TABLET | Freq: Four times a day (QID) | ORAL | Status: DC | PRN
  Administered 2022-01-06 – 2022-01-07 (×2): 4 mg via ORAL
  Filled 2022-01-06 (×2): qty 1

## 2022-01-06 MED ORDER — OXYCODONE HCL 5 MG PO TABS
5.0000 mg | ORAL_TABLET | Freq: Four times a day (QID) | ORAL | 0 refills | Status: DC | PRN
  Filled 2022-01-06: qty 15, 4d supply, fill #0

## 2022-01-06 MED ORDER — MIDAZOLAM HCL 2 MG/2ML IJ SOLN
INTRAMUSCULAR | Status: AC
  Filled 2022-01-06: qty 2

## 2022-01-06 MED ORDER — IOHEXOL 350 MG/ML SOLN
80.0000 mL | Freq: Once | INTRAVENOUS | Status: AC | PRN
  Administered 2022-01-05: 80 mL via INTRAVENOUS

## 2022-01-06 MED ORDER — DEXMEDETOMIDINE HCL IN NACL 80 MCG/20ML IV SOLN
INTRAVENOUS | Status: AC
  Filled 2022-01-06: qty 20

## 2022-01-06 MED ORDER — ADULT MULTIVITAMIN W/MINERALS CH
1.0000 | ORAL_TABLET | Freq: Every day | ORAL | Status: DC
  Administered 2022-01-06 – 2022-01-07 (×2): 1 via ORAL
  Filled 2022-01-06 (×2): qty 1

## 2022-01-06 MED ORDER — LACTATED RINGERS IV SOLN: INTRAVENOUS | Status: DC | PRN

## 2022-01-06 MED ORDER — LORAZEPAM 2 MG/ML IJ SOLN
INTRAMUSCULAR | Status: AC
  Filled 2022-01-06: qty 2

## 2022-01-06 MED ORDER — LEVETIRACETAM 500 MG PO TABS
500.0000 mg | ORAL_TABLET | Freq: Two times a day (BID) | ORAL | Status: DC
  Administered 2022-01-06 – 2022-01-07 (×2): 500 mg via ORAL
  Filled 2022-01-06 (×2): qty 1

## 2022-01-06 MED ORDER — METOPROLOL TARTRATE 5 MG/5ML IV SOLN: 5.0000 mg | Freq: Four times a day (QID) | INTRAVENOUS | Status: DC | PRN

## 2022-01-06 MED ORDER — OXYCODONE HCL 5 MG PO TABS
10.0000 mg | ORAL_TABLET | ORAL | Status: DC | PRN
  Administered 2022-01-07: 10 mg via ORAL
  Filled 2022-01-06: qty 2

## 2022-01-06 MED ORDER — OXYCODONE HCL 5 MG PO TABS: 5.0000 mg | ORAL_TABLET | ORAL | Status: DC | PRN

## 2022-01-06 MED ORDER — FENTANYL CITRATE (PF) 250 MCG/5ML IJ SOLN
INTRAMUSCULAR | Status: AC
  Filled 2022-01-06: qty 5

## 2022-01-06 MED ORDER — FENTANYL CITRATE (PF) 100 MCG/2ML IJ SOLN
INTRAMUSCULAR | Status: DC | PRN
  Administered 2022-01-06: 50 ug via INTRAVENOUS

## 2022-01-06 MED ORDER — POLYETHYLENE GLYCOL 3350 17 G PO PACK: 17.0000 g | PACK | Freq: Every day | ORAL | 0 refills | Status: DC | PRN

## 2022-01-06 MED ORDER — LORAZEPAM 1 MG PO TABS
1.0000 mg | ORAL_TABLET | ORAL | Status: DC | PRN
  Administered 2022-01-06: 1 mg via ORAL
  Filled 2022-01-06: qty 1

## 2022-01-06 MED ORDER — THIAMINE MONONITRATE 100 MG PO TABS
100.0000 mg | ORAL_TABLET | Freq: Every day | ORAL | Status: DC
  Administered 2022-01-06 – 2022-01-07 (×2): 100 mg via ORAL
  Filled 2022-01-06 (×2): qty 1

## 2022-01-06 MED ORDER — KETAMINE HCL 50 MG/5ML IJ SOSY
PREFILLED_SYRINGE | INTRAMUSCULAR | Status: AC
  Filled 2022-01-06: qty 5

## 2022-01-06 MED ORDER — HYDRALAZINE HCL 20 MG/ML IJ SOLN: 10.0000 mg | INTRAMUSCULAR | Status: DC | PRN

## 2022-01-06 MED ORDER — HYDROMORPHONE HCL 1 MG/ML IJ SOLN
0.2500 mg | INTRAMUSCULAR | Status: DC | PRN
  Administered 2022-01-06 (×2): 0.5 mg via INTRAVENOUS

## 2022-01-06 MED ORDER — GABAPENTIN 300 MG PO CAPS
300.0000 mg | ORAL_CAPSULE | Freq: Three times a day (TID) | ORAL | Status: DC
  Administered 2022-01-06 – 2022-01-07 (×4): 300 mg via ORAL
  Filled 2022-01-06 (×4): qty 1

## 2022-01-06 MED ORDER — PHENYLEPHRINE HCL (PRESSORS) 10 MG/ML IV SOLN
INTRAVENOUS | Status: DC | PRN
  Administered 2022-01-06 (×6): 160 ug via INTRAVENOUS

## 2022-01-06 MED ORDER — MIDAZOLAM HCL 5 MG/5ML IJ SOLN
INTRAMUSCULAR | Status: DC | PRN
  Administered 2022-01-06: 2 mg via INTRAVENOUS

## 2022-01-06 MED ORDER — ACETAMINOPHEN 500 MG PO TABS: 1000.0000 mg | ORAL_TABLET | Freq: Four times a day (QID) | ORAL | 0 refills | Status: DC | PRN

## 2022-01-06 MED ORDER — ONDANSETRON HCL 4 MG/2ML IJ SOLN
INTRAMUSCULAR | Status: AC
  Filled 2022-01-06: qty 2

## 2022-01-06 MED ORDER — DEXMEDETOMIDINE HCL IN NACL 80 MCG/20ML IV SOLN
INTRAVENOUS | Status: DC | PRN
  Administered 2022-01-06: 8 ug via BUCCAL
  Administered 2022-01-06: 20 ug via BUCCAL

## 2022-01-06 MED ORDER — PROPOFOL 10 MG/ML IV BOLUS
INTRAVENOUS | Status: AC
  Filled 2022-01-06: qty 20

## 2022-01-06 MED ORDER — LACTATED RINGERS IV SOLN: INTRAVENOUS | Status: DC

## 2022-01-06 MED ORDER — SUCCINYLCHOLINE CHLORIDE 200 MG/10ML IV SOSY
PREFILLED_SYRINGE | INTRAVENOUS | Status: DC | PRN
  Administered 2022-01-06: 160 mg via INTRAVENOUS

## 2022-01-06 MED ORDER — LORAZEPAM 2 MG/ML IJ SOLN: 1.0000 mg | INTRAMUSCULAR | Status: DC | PRN

## 2022-01-06 MED ORDER — OXYCODONE HCL 5 MG PO TABS
ORAL_TABLET | ORAL | Status: AC
  Filled 2022-01-06: qty 2

## 2022-01-06 MED ORDER — CEFAZOLIN SODIUM-DEXTROSE 2-4 GM/100ML-% IV SOLN
2.0000 g | Freq: Three times a day (TID) | INTRAVENOUS | Status: AC
  Administered 2022-01-06 (×2): 2 g via INTRAVENOUS
  Filled 2022-01-06 (×2): qty 100

## 2022-01-06 MED ORDER — ROCURONIUM BROMIDE 100 MG/10ML IV SOLN
INTRAVENOUS | Status: DC | PRN
  Administered 2022-01-06: 70 mg via INTRAVENOUS

## 2022-01-06 SURGICAL SUPPLY — 26 items
CANISTER SUCT 3000ML PPV (MISCELLANEOUS) ×2 IMPLANT
COVER SURGICAL LIGHT HANDLE (MISCELLANEOUS) ×2 IMPLANT
DRAPE LAPAROTOMY 100X72 PEDS (DRAPES) IMPLANT
ELECT REM PT RETURN 9FT ADLT (ELECTROSURGICAL) ×2
ELECTRODE REM PT RTRN 9FT ADLT (ELECTROSURGICAL) ×2 IMPLANT
GLOVE INDICATOR 7.0 STRL GRN (GLOVE) IMPLANT
GOWN STRL REUS W/ TWL LRG LVL3 (GOWN DISPOSABLE) ×4 IMPLANT
GOWN STRL REUS W/TWL LRG LVL3 (GOWN DISPOSABLE) ×4
KIT BASIN OR (CUSTOM PROCEDURE TRAY) IMPLANT
KIT TURNOVER KIT B (KITS) ×2 IMPLANT
NS IRRIG 1000ML POUR BTL (IV SOLUTION) ×2 IMPLANT
PACK BASIC III (CUSTOM PROCEDURE TRAY) ×2
PACK SRG BSC III STRL LF ECLPS (CUSTOM PROCEDURE TRAY) IMPLANT
PAD ARMBOARD 7.5X6 YLW CONV (MISCELLANEOUS) ×2 IMPLANT
PENCIL BUTTON HOLSTER BLD 10FT (ELECTRODE) IMPLANT
SPONGE T-LAP 18X18 ~~LOC~~+RFID (SPONGE) IMPLANT
STAPLER VISISTAT 35W (STAPLE) IMPLANT
SUT ETHILON 2 0 FS 18 (SUTURE) IMPLANT
SUT VIC AB 3-0 SH 27 (SUTURE) ×2
SUT VIC AB 3-0 SH 27X BRD (SUTURE) IMPLANT
SUT VICRYL AB 2 0 TIES (SUTURE) IMPLANT
SUT VICRYL AB 3 0 TIES (SUTURE) IMPLANT
TOWEL GREEN STERILE (TOWEL DISPOSABLE) ×2 IMPLANT
TOWEL GREEN STERILE FF (TOWEL DISPOSABLE) ×2 IMPLANT
TUBE CONNECTING 12X1/4 (SUCTIONS) IMPLANT
YANKAUER SUCT BULB TIP NO VENT (SUCTIONS) IMPLANT

## 2022-01-06 NOTE — Transfer of Care (Signed)
Immediate Anesthesia Transfer of Care Note  Patient: Perry Day  Procedure(s) Performed: WOUND EXPLORATION (Left) IRRIGATION AND DEBRIDEMENT WOUND (Left: Shoulder)  Patient Location: PACU  Anesthesia Type:General  Level of Consciousness: drowsy  Airway & Oxygen Therapy: Patient Spontanous Breathing and Patient connected to nasal cannula oxygen  Post-op Assessment: Report given to RN, Post -op Vital signs reviewed and stable, and Patient moving all extremities  Post vital signs: Reviewed and stable  Last Vitals:  Vitals Value Taken Time  BP 108/72 01/06/22 0245  Temp    Pulse    Resp 17 01/06/22 0251  SpO2    Vitals shown include unvalidated device data.  Last Pain:  Vitals:   01/06/22 0025  TempSrc: Temporal  PainSc:          Complications: No notable events documented.

## 2022-01-06 NOTE — Anesthesia Postprocedure Evaluation (Signed)
Anesthesia Post Note  Patient: Perry Day  Procedure(s) Performed: WOUND EXPLORATION (Left) IRRIGATION AND DEBRIDEMENT WOUND (Left: Shoulder)     Patient location during evaluation: PACU Anesthesia Type: General Level of consciousness: patient cooperative, oriented and sedated Pain management: pain level controlled Vital Signs Assessment: post-procedure vital signs reviewed and stable Respiratory status: spontaneous breathing, nonlabored ventilation and respiratory function stable Cardiovascular status: blood pressure returned to baseline and stable Postop Assessment: no apparent nausea or vomiting Anesthetic complications: no   No notable events documented.  Last Vitals:  Vitals:   01/06/22 0315 01/06/22 0330  BP: 106/63 (!) 105/90  Pulse: 70 89  Resp: 12 16  Temp:    SpO2: 100% 100%    Last Pain:  Vitals:   01/06/22 0330  TempSrc:   PainSc: 4                  Nayelly Laughman,E. Jacody Beneke

## 2022-01-06 NOTE — Progress Notes (Signed)
Orthopedic Tech Progress Note Patient Details:  Perry Day 1985/06/06 [id number]  PACU RN called requesting a LARGE ARM SLING for patient, state she would apply once patient was dressed   Ortho Devices Type of Ortho Device: Arm sling Ortho Device/Splint Location: LUE Ortho Device/Splint Interventions: Ordered, Other (comment)   Post Interventions Patient Tolerated: Other (comment) Instructions Provided: Other (comment)  [name] 01/06/2022, 11:07 AM
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
## 2022-01-06 NOTE — Progress Notes (Signed)
PT Cancellation Note  Patient Details Name: Perry Day MRN: [id number] DOB: 01-21-1986   Cancelled Treatment:    Reason Eval/Treat Not Completed: Patient at procedure or test/unavailable Pt currently in PACU s/p I and D.  Will f/u later today as able. Anise Salvo, PT Acute Rehab [facility] [phone]   Rayetta Humphrey 01/06/2022, 9:44 AM

## 2022-01-06 NOTE — Op Note (Signed)
Operative Note  Perry Day  [id number]  [id number]  01/06/2022   Surgeon: Phylliss Blakes MD FACS   Procedure performed: Irrigation and debridement of left posterior shoulder laceration with complex closure 10x5x5cm   Preop diagnosis: Posterior left shoulder laceration Post-op diagnosis/intraop findings: Same   Specimens: no Retained items: no  EBL: Minimal cc Complications: none   Description of procedure: After obtaining informed consent the patient was taken to the operating room and placed supine on operating room table where general endotracheal anesthesia was initiated, preoperative antibiotics were administered, SCDs applied, and a formal timeout was performed.  The patient was then repositioned in the right lateral decubitus with all pressure points appropriately padded.  The pressure dressing that had been placed in the emergency department was carefully removed and then the left shoulder was prepped and draped in usual sterile fashion with Betadine.  The wound was irrigated with warm sterile saline and confirmed to be free of debris or significant contamination.  There was venous oozing emanating from the deeper portions of the wound.  Much of this came from severed component of the deltoid muscle.  The periosteum of the humerus was palpable in the deep part of the wound but there was no palpable fracture or laceration within the deeper tissues.  The severed muscle component of the deltoid was suture-ligated with 3-0 Vicryl ties to improve hemostasis.  The wound was again irrigated and inspected.  Additional cautery was employed for hemostasis.  The fascia was then closed with interrupted 3-0 Vicryl's, followed by a layer of interrupted 3-0 Vicryl in the deep subcutaneous/deep dermal space.  The skin was then closed with interrupted vertical mattress 2-0 nylon's.  Sterile dressing of Adaptic, multiple 4 x 4's and tape were then applied.  We also irrigated the wound on the patient's  back and confirmed that this was hemostatic, sterile dressing was applied to this as well.  The patient was then returned to the supine position, awakened, extubated and taken to PACU in stable condition.    All counts were correct at the completion of the case.

## 2022-01-06 NOTE — Anesthesia Procedure Notes (Signed)
Procedure Name: Intubation Date/Time: 01/06/2022 1:18 AM  Performed by: Kambri Dismore T, CRNAPre-anesthesia Checklist: Patient identified, Emergency Drugs available, Suction available and Patient being monitored Patient Re-evaluated:Patient Re-evaluated prior to induction Oxygen Delivery Method: Circle system utilized Preoxygenation: Pre-oxygenation with 100% oxygen Induction Type: IV induction, Rapid sequence and Cricoid Pressure applied Ventilation: Mask ventilation without difficulty Laryngoscope Size: Miller and 2 Grade View: Grade II Tube type: Oral Tube size: 7.5 mm Number of attempts: 1 Airway Equipment and Method: Stylet and Oral airway Placement Confirmation: ETT inserted through vocal cords under direct vision, positive ETCO2 and breath sounds checked- equal and bilateral Secured at: 22 cm Tube secured with: Tape Dental Injury: Teeth and Oropharynx as per pre-operative assessment

## 2022-01-07 ENCOUNTER — Encounter (HOSPITAL_COMMUNITY): Payer: Self-pay | Admitting: Surgery

## 2022-01-07 ENCOUNTER — Other Ambulatory Visit (HOSPITAL_COMMUNITY): Payer: Self-pay

## 2022-01-07 LAB — CBC
HCT: 27.1 % — ABNORMAL LOW (ref 39.0–52.0)
Hemoglobin: 9.4 g/dL — ABNORMAL LOW (ref 13.0–17.0)
MCH: 30.5 pg (ref 26.0–34.0)
MCHC: 34.7 g/dL (ref 30.0–36.0)
MCV: 88 fL (ref 80.0–100.0)
Platelets: 278 10*3/uL (ref 150–400)
RBC: 3.08 MIL/uL — ABNORMAL LOW (ref 4.22–5.81)
RDW: 13 % (ref 11.5–15.5)
WBC: 4.8 10*3/uL (ref 4.0–10.5)
nRBC: 0.4 % — ABNORMAL HIGH (ref 0.0–0.2)

## 2022-01-07 LAB — BASIC METABOLIC PANEL
Anion gap: 7 (ref 5–15)
BUN: 5 mg/dL — ABNORMAL LOW (ref 6–20)
CO2: 29 mmol/L (ref 22–32)
Calcium: 7.6 mg/dL — ABNORMAL LOW (ref 8.9–10.3)
Chloride: 102 mmol/L (ref 98–111)
Creatinine, Ser: 0.92 mg/dL (ref 0.61–1.24)
GFR, Estimated: >60 mL/min (ref >60)
Glucose, Bld: 88 mg/dL (ref 70–99)
Potassium: 3.5 mmol/L (ref 3.5–5.1)
Sodium: 140 mmol/L (ref 135–145)

## 2022-01-07 MED ORDER — SODIUM CHLORIDE 0.9 % IV SOLN: 12.5000 mg | Freq: Four times a day (QID) | INTRAVENOUS | Status: DC | PRN
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
## 2022-01-07 NOTE — Plan of Care (Signed)
  Problem: Activity: Goal: Risk for activity intolerance will decrease Outcome: Not Progressing   Problem: Pain Managment: Goal: General experience of comfort will improve Outcome: Not Progressing   Problem: Skin Integrity: Goal: Risk for impaired skin integrity will decrease Outcome: Not Progressing   

## 2022-01-07 NOTE — Progress Notes (Signed)
Explained discharge instructions to patient. Reviewed follow up appointment and next medication administration times. Also reviewed laceration care education. Patient verbalized having an understanding for instructions given. Patient verbalized that he is missing his coat and wifi box. Searched the lost and found in the ED as well as checked security. Patient didn't receive his TOC meds. TOC pharmacy was closed at the time of discharging. Notified the patient's RN and the covering MD Kinsinger. Spoke with him to let him know the patient doesn't have his TOC meds. Md. Sheliah Hatch was going call in his prescription but the patient said he couldn't afford the meds which why he was getting them from the [facility] pharmacy. Patient said he will come tomorrow to the hospital to pick up his meds. Md. Was made aware and agreed that it would be ok. Assessed the patient's pain level and comfort before completing his discharge. Patient confirmed tylenol is suitable for his pain and that he has keppra at home. IV was removed. No other needs verbalized. Transported downstairs for discharge.
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
## 2022-01-07 NOTE — Progress Notes (Signed)
OT Cancellation Note  Patient Details Name: Perry Day MRN: [id number] DOB: [birth year]   Cancelled Treatment:    Reason Eval/Treat Not Completed:  (Pt reported just vomited and willing to try in a little bit.) Alphia Moh OTR/L  Acute Rehab Services  [phone] office number    Alphia Moh 01/07/2022, 9:19 AM

## 2022-01-07 NOTE — Plan of Care (Signed)
  Problem: Education: Goal: Knowledge of General Education information will improve Description: Including pain rating scale, medication(s)/side effects and non-pharmacologic comfort measures Outcome: Adequate for Discharge   Problem: Health Behavior/Discharge Planning: Goal: Ability to manage health-related needs will improve Outcome: Adequate for Discharge   Problem: Clinical Measurements: Goal: Ability to maintain clinical measurements within normal limits will improve Outcome: Adequate for Discharge Goal: Will remain free from infection Outcome: Adequate for Discharge Goal: Diagnostic test results will improve Outcome: Adequate for Discharge Goal: Respiratory complications will improve Outcome: Adequate for Discharge Goal: Cardiovascular complication will be avoided Outcome: Adequate for Discharge   Problem: Activity: Goal: Risk for activity intolerance will decrease Outcome: Adequate for Discharge   Problem: Nutrition: Goal: Adequate nutrition will be maintained Outcome: Adequate for Discharge   Problem: Coping: Goal: Level of anxiety will decrease Outcome: Adequate for Discharge   Problem: Elimination: Goal: Will not experience complications related to bowel motility Outcome: Adequate for Discharge Goal: Will not experience complications related to urinary retention Outcome: Adequate for Discharge   Problem: Pain Managment: Goal: General experience of comfort will improve Outcome: Adequate for Discharge   Problem: Safety: Goal: Ability to remain free from injury will improve Outcome: Adequate for Discharge   Problem: Skin Integrity: Goal: Risk for impaired skin integrity will decrease Outcome: Adequate for Discharge   Problem: Acute Rehab OT Goals (only OT should resolve) Goal: Pt. Will Perform Upper Body Bathing Outcome: Adequate for Discharge Goal: Pt. Will Perform Lower Body Bathing Outcome: Adequate for Discharge Goal: Pt. Will Perform Upper Body  Dressing Outcome: Adequate for Discharge Goal: Pt. Will Perform Lower Body Dressing Outcome: Adequate for Discharge Goal: Pt. Will Transfer To Toilet Outcome: Adequate for Discharge Goal: Pt. Will Perform Tub/Shower Transfer Outcome: Adequate for Discharge Goal: Pt/Caregiver Will Perform Home Exercise Program Outcome: Adequate for Discharge

## 2022-01-08 ENCOUNTER — Other Ambulatory Visit (HOSPITAL_COMMUNITY): Payer: Self-pay

## 2022-01-08 ENCOUNTER — Encounter (HOSPITAL_COMMUNITY): Payer: Self-pay | Admitting: Emergency Medicine

## 2022-01-13 ENCOUNTER — Encounter: Payer: Self-pay | Admitting: Neurology

## 2022-01-18 ENCOUNTER — Ambulatory Visit: Payer: Medicaid Other | Attending: Physician Assistant | Admitting: Occupational Therapy

## 2022-01-18 ENCOUNTER — Encounter: Payer: Self-pay | Admitting: Occupational Therapy

## 2022-01-18 DIAGNOSIS — M6281 Muscle weakness (generalized): Secondary | ICD-10-CM | POA: Diagnosis not present

## 2022-01-18 DIAGNOSIS — S41112A Laceration without foreign body of left upper arm, initial encounter: Secondary | ICD-10-CM | POA: Diagnosis present
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
## 2022-01-28 ENCOUNTER — Encounter: Payer: Self-pay | Admitting: Occupational Therapy

## 2022-01-28 ENCOUNTER — Ambulatory Visit: Payer: Medicaid Other | Admitting: Occupational Therapy

## 2022-01-28 DIAGNOSIS — M6281 Muscle weakness (generalized): Secondary | ICD-10-CM | POA: Diagnosis not present

## 2022-01-28 DIAGNOSIS — S41112A Laceration without foreign body of left upper arm, initial encounter: Secondary | ICD-10-CM

## 2022-02-04 ENCOUNTER — Ambulatory Visit: Payer: Medicaid Other | Admitting: Occupational Therapy

## 2022-02-07 ENCOUNTER — Other Ambulatory Visit: Payer: Self-pay

## 2022-02-07 ENCOUNTER — Emergency Department (HOSPITAL_COMMUNITY)
Admission: EM | Admit: 2022-02-07 | Discharge: 2022-02-07 | Disposition: A | Discharge status: Home / Self Care | Payer: Medicaid Other | Attending: Emergency Medicine | Admitting: Emergency Medicine

## 2022-02-07 DIAGNOSIS — R569 Unspecified convulsions: Secondary | ICD-10-CM

## 2022-02-07 DIAGNOSIS — G40909 Epilepsy, unspecified, not intractable, without status epilepticus: Secondary | ICD-10-CM | POA: Insufficient documentation

## 2022-02-07 LAB — BASIC METABOLIC PANEL
Anion gap: 11 (ref 5–15)
BUN: 8 mg/dL (ref 6–20)
CO2: 20 mmol/L — ABNORMAL LOW (ref 22–32)
Calcium: 8.3 mg/dL — ABNORMAL LOW (ref 8.9–10.3)
Chloride: 104 mmol/L (ref 98–111)
Creatinine, Ser: 1.04 mg/dL (ref 0.61–1.24)
GFR, Estimated: >60 mL/min (ref >60)
Glucose, Bld: 82 mg/dL (ref 70–99)
Potassium: 4.1 mmol/L (ref 3.5–5.1)
Sodium: 135 mmol/L (ref 135–145)

## 2022-02-07 LAB — CBG MONITORING, ED: Glucose-Capillary: 108 mg/dL — ABNORMAL HIGH (ref 70–99)

## 2022-02-07 MED ORDER — LEVETIRACETAM IN NACL 1500 MG/100ML IV SOLN
1500.0000 mg | Freq: Once | INTRAVENOUS | Status: AC
  Administered 2022-02-07: 1500 mg via INTRAVENOUS
  Filled 2022-02-07: qty 100

## 2022-02-07 NOTE — Discharge Instructions (Addendum)
It was our pleasure to provide your ER care today - we hope that you feel better.  Drink plenty of fluids/stay well hydrated. Take your seizure meds as prescribed, making sure not to delay or skip doses.  Seizure precautions, see attached - no driving or operating heavy machinery until cleared to do so by your doctor/neurologist. Follow up with neurology in one week.  Return to ER if worse, new symptoms, new/severe pain, fevers, recurrent seizures, or other concern.
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
## 2022-02-11 ENCOUNTER — Ambulatory Visit: Payer: Self-pay | Admitting: Neurology

## 2022-02-11 ENCOUNTER — Encounter: Payer: Self-pay | Admitting: Neurology

## 2022-02-11 DIAGNOSIS — Z029 Encounter for administrative examinations, unspecified: Secondary | ICD-10-CM

## 2022-02-18 ENCOUNTER — Ambulatory Visit: Payer: Medicaid Other | Attending: Physician Assistant | Admitting: Occupational Therapy

## 2022-02-18 ENCOUNTER — Encounter: Payer: Self-pay | Admitting: Occupational Therapy

## 2022-02-18 DIAGNOSIS — M6281 Muscle weakness (generalized): Secondary | ICD-10-CM | POA: Insufficient documentation

## 2022-02-18 DIAGNOSIS — S41112A Laceration without foreign body of left upper arm, initial encounter: Secondary | ICD-10-CM
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
## 2022-02-22 ENCOUNTER — Other Ambulatory Visit: Payer: Self-pay

## 2022-02-22 ENCOUNTER — Emergency Department (HOSPITAL_COMMUNITY)
Admission: EM | Admit: 2022-02-22 | Discharge: 2022-02-23 | Disposition: A | Discharge status: Home / Self Care | Payer: Medicaid Other | Attending: Emergency Medicine | Admitting: Emergency Medicine

## 2022-02-22 DIAGNOSIS — E876 Hypokalemia: Secondary | ICD-10-CM | POA: Insufficient documentation

## 2022-02-22 DIAGNOSIS — G40909 Epilepsy, unspecified, not intractable, without status epilepticus: Secondary | ICD-10-CM | POA: Diagnosis not present

## 2022-02-22 DIAGNOSIS — R030 Elevated blood-pressure reading, without diagnosis of hypertension: Secondary | ICD-10-CM | POA: Diagnosis not present

## 2022-02-22 DIAGNOSIS — R110 Nausea: Secondary | ICD-10-CM | POA: Diagnosis not present

## 2022-02-22 DIAGNOSIS — R569 Unspecified convulsions: Secondary | ICD-10-CM | POA: Diagnosis present

## 2022-02-22 LAB — CBC WITH DIFFERENTIAL/PLATELET
Abs Immature Granulocytes: 0.01 10*3/uL (ref 0.00–0.07)
Basophils Absolute: 0 10*3/uL (ref 0.0–0.1)
Basophils Relative: 1 %
Eosinophils Absolute: 0 10*3/uL (ref 0.0–0.5)
Eosinophils Relative: 0 %
HCT: 36.8 % — ABNORMAL LOW (ref 39.0–52.0)
Hemoglobin: 11.3 g/dL — ABNORMAL LOW (ref 13.0–17.0)
Immature Granulocytes: 0 %
Lymphocytes Relative: 20 %
Lymphs Abs: 1.1 10*3/uL (ref 0.7–4.0)
MCH: 24.5 pg — ABNORMAL LOW (ref 26.0–34.0)
MCHC: 30.7 g/dL (ref 30.0–36.0)
MCV: 79.8 fL — ABNORMAL LOW (ref 80.0–100.0)
Monocytes Absolute: 0.4 10*3/uL (ref 0.1–1.0)
Monocytes Relative: 8 %
Neutro Abs: 3.8 10*3/uL (ref 1.7–7.7)
Neutrophils Relative %: 71 %
Platelets: 274 10*3/uL (ref 150–400)
RBC: 4.61 MIL/uL (ref 4.22–5.81)
RDW: 17 % — ABNORMAL HIGH (ref 11.5–15.5)
WBC: 5.4 10*3/uL (ref 4.0–10.5)
nRBC: 0 % (ref 0.0–0.2)

## 2022-02-22 LAB — BASIC METABOLIC PANEL
Anion gap: 15 (ref 5–15)
BUN: 10 mg/dL (ref 6–20)
CO2: 27 mmol/L (ref 22–32)
Calcium: 8.7 mg/dL — ABNORMAL LOW (ref 8.9–10.3)
Chloride: 95 mmol/L — ABNORMAL LOW (ref 98–111)
Creatinine, Ser: 1.17 mg/dL (ref 0.61–1.24)
GFR, Estimated: >60 mL/min (ref >60)
Glucose, Bld: 77 mg/dL (ref 70–99)
Potassium: 3.4 mmol/L — ABNORMAL LOW (ref 3.5–5.1)
Sodium: 137 mmol/L (ref 135–145)

## 2022-02-22 LAB — CBG MONITORING, ED: Glucose-Capillary: 73 mg/dL (ref 70–99)

## 2022-02-22 NOTE — ED Triage Notes (Signed)
Pt BIB [facility] from home. HX seizure. Pt states he felt like he was going to have a seizure. No seizure today. Missed morning does of Keppra.

## 2022-02-22 NOTE — ED Provider Triage Note (Signed)
Emergency Medicine Provider Triage Evaluation Note  Perry Day , a 37 y.o. male  was evaluated in triage.  Pt complains of concerns for seizure onset.  Patient has a history of seizures with his last 1 being a couple weeks ago.  He notes that typically before he has a seizure he has a change in taste, smell, which he noticed today.  He notes he initially missed his dose of Keppra this morning however took it after he began to experience the symptoms.  His symptoms resolved.  Patient is here to be checked out.  Patient denies having a seizure today.  Denies chest pain, shortness of breath, vomiting. Review of Systems  Positive:  Negative:   Physical Exam  BP (!) 140/96 (BP Location: Right Arm)   Pulse 74   Temp (!) 97.5 F (36.4 C) (Oral)   Resp 18   Ht 5\' 9"  (1.753 m)   Wt 74.4 kg   SpO2 (!) 88%   BMI 24.22 kg/m  Gen:   Awake, no distress   Resp:  Normal effort  MSK:   Moves extremities without difficulty  Other:    Medical Decision Making  Medically screening exam initiated at 8:29 PM.  Appropriate orders placed.  Dylan Parke was informed that the remainder of the evaluation will be completed by another provider, this initial triage assessment does not replace that evaluation, and the importance of remaining in the ED until their evaluation is complete.     Gearald Stonebraker A, PA-C 02/22/22 2029

## 2022-02-23 ENCOUNTER — Other Ambulatory Visit (HOSPITAL_COMMUNITY): Payer: Self-pay

## 2022-02-23 MED ORDER — POTASSIUM CHLORIDE CRYS ER 20 MEQ PO TBCR
40.0000 meq | EXTENDED_RELEASE_TABLET | Freq: Once | ORAL | Status: AC
  Administered 2022-02-23: 40 meq via ORAL
  Filled 2022-02-23: qty 2

## 2022-02-23 MED ORDER — POTASSIUM CHLORIDE CRYS ER 20 MEQ PO TBCR
20.0000 meq | EXTENDED_RELEASE_TABLET | Freq: Two times a day (BID) | ORAL | 0 refills | Status: DC
  Filled 2022-02-23: qty 10, 5d supply, fill #0

## 2022-02-23 MED ORDER — ONDANSETRON 4 MG PO TBDP
8.0000 mg | ORAL_TABLET | Freq: Once | ORAL | Status: AC
  Administered 2022-02-23: 8 mg via ORAL
  Filled 2022-02-23: qty 2

## 2022-02-23 MED ORDER — ONDANSETRON 8 MG PO TBDP
8.0000 mg | ORAL_TABLET | Freq: Three times a day (TID) | ORAL | 0 refills | Status: DC | PRN
  Filled 2022-02-23: qty 20, 7d supply, fill #0

## 2022-02-23 MED ORDER — LEVETIRACETAM 500 MG PO TABS
500.0000 mg | ORAL_TABLET | Freq: Once | ORAL | Status: AC
  Administered 2022-02-23: 500 mg via ORAL
  Filled 2022-02-23: qty 1

## 2022-02-23 NOTE — ED Provider Notes (Signed)
[facility] Provider Note   CSN: [id number] Arrival date & time: 02/22/22  2024     History  Chief Complaint  Patient presents with   Seizures    Perry Day is a 37 y.o. male.  The history is provided by the patient.  Seizures He has history of traumatic brain injury, seizure disorder, DVT not currently on anticoagulants and comes in because he smelled a sweet smell which is his typical aura for seizures.  He did not actually have a seizure but was concerned that he was going to have one.  He states he has taken his levetiracetam today as prescribed, but he does admit that he has missed some doses intermittently.  He has also had some nausea today and states he has not been eating today.  He denies fever or chills.  Denies any abdominal pain.   Home Medications Prior to Admission medications   Medication Sig Start Date End Date Taking? Authorizing Provider  acetaminophen (TYLENOL) 500 MG tablet Take 2 tablets (1,000 mg total) by mouth every 6 (six) hours as needed for mild pain. 01/06/22   Meuth, Brooke A, PA-C  amoxicillin-clavulanate (AUGMENTIN) 875-125 MG tablet Take 1 tablet by mouth every 12 (twelve) hours. 11/23/21   Elgie Congo, MD  levETIRAcetam (KEPPRA) 500 MG tablet Take 1 tablet (500 mg total) by mouth 2 (two) times daily. 09/01/21   Drenda Freeze, MD  levETIRAcetam (KEPPRA) 500 MG tablet Take 1 tablet (500 mg total) by mouth 2 (two) times daily. 11/23/21 02/21/22  Elgie Congo, MD  levETIRAcetam (KEPPRA) 500 MG tablet Take 1 tablet (500 mg total) by mouth 2 (two) times daily. 01/06/22 02/07/22  Meuth, Brooke A, PA-C  oxyCODONE (OXY IR/ROXICODONE) 5 MG immediate release tablet Take 1 tablet (5 mg total) by mouth every 6 (six) hours as needed for severe pain. Patient not taking: Reported on 01/18/2022 01/06/22   Margie Billet A, PA-C  polyethylene glycol (MIRALAX) 17 g packet Take 17 g by mouth daily as needed for  mild constipation. Patient not taking: Reported on 01/18/2022 01/06/22   Margie Billet A, PA-C  APIXABAN [place]) VTE STARTER PACK (10MG  AND 5MG ) Take as directed on package: start with two-5mg  tablets twice daily for 7 days. On day 8, switch to one-5mg  tablet twice daily. 04/26/20 04/26/20  Tedd Sias, PA      Allergies    Patient has no known allergies.    Review of Systems   Review of Systems  Neurological:  Positive for seizures.  All other systems reviewed and are negative.   Physical Exam Updated Vital Signs BP (!) 142/97   Pulse 77   Temp 99.3 F (37.4 C) (Oral)   Resp 20   Ht 5\' 9"  (1.753 m)   Wt 74.4 kg   SpO2 98%   BMI 24.22 kg/m  Physical Exam Vitals and nursing note reviewed.   37 year old male, resting comfortably and in no acute distress. Vital signs are significant for mildly elevated blood pressure. Oxygen saturation is 98%, which is normal. Head is normocephalic and atraumatic. PERRLA, EOMI. Oropharynx is clear. Neck is nontender and supple without adenopathy or JVD. Back is nontender and there is no CVA tenderness. Lungs are clear without rales, wheezes, or rhonchi. Chest is nontender. Heart has regular rate and rhythm without murmur. Abdomen is soft, flat, nontender. Extremities have no cyanosis or edema, full range of motion is present. Skin is warm and  dry without rash. Neurologic: Mental status is normal, cranial nerves are intact, moves all extremities equally.  No tremulousness is noted.  ED Results / Procedures / Treatments   Labs (all labs ordered are listed, but only abnormal results are displayed) Labs Reviewed  BASIC METABOLIC PANEL - Abnormal; Notable for the following components:      Result Value   Potassium 3.4 (*)    Chloride 95 (*)    Calcium 8.7 (*)    All other components within normal limits  CBC WITH DIFFERENTIAL/PLATELET - Abnormal; Notable for the following components:   Hemoglobin 11.3 (*)    HCT 36.8 (*)    MCV 79.8  (*)    MCH 24.5 (*)    RDW 17.0 (*)    All other components within normal limits  CBG MONITORING, ED   Procedures Procedures    Medications Ordered in ED Medications  potassium chloride SA (KLOR-CON M) CR tablet 40 mEq (40 mEq Oral Given 02/23/22 0257)  ondansetron (ZOFRAN-ODT) disintegrating tablet 8 mg (8 mg Oral Given 02/23/22 0258)  levETIRAcetam (KEPPRA) tablet 500 mg (500 mg Oral Given 02/23/22 0301)    ED Course/ Medical Decision Making/ A&P                             Medical Decision Making  Patient with history of seizure disorder and experiencing what is his typical aura.  Even though he has not had an actual seizure, there is concern that his anticonvulsant level was subtherapeutic.  I have ordered an extra dose of oral levetiracetam.  I have reviewed and interpreted his laboratory test, and my interpretation is mild hypokalemia, stable anemia.  I have ordered a dose of oral potassium.  I am discharging him with instructions to continue taking levetiracetam twice a day and make sure he takes 2 doses tomorrow even though he got an extra dose in the emergency department.  I have also discharged him with a prescription for ondansetron and oral potassium.  Final Clinical Impression(s) / ED Diagnoses Final diagnoses:  Seizure disorder ([place])  Hypokalemia  Nausea  Elevated blood pressure reading without diagnosis of hypertension    Rx / DC Orders ED Discharge Orders          Ordered    ondansetron (ZOFRAN-ODT) 8 MG disintegrating tablet  Every 8 hours PRN        02/23/22 0250    potassium chloride SA (KLOR-CON M) 20 MEQ tablet  2 times daily        02/23/22 5573              Delora Fuel, MD 22/02/54 [phone]

## 2022-02-23 NOTE — Discharge Instructions (Addendum)
Please take your seizure medication twice a day, as prescribed.  Even though you got an extra dose of the medication tonight, take your usual 2 doses tomorrow.

## 2022-02-25 ENCOUNTER — Encounter: Payer: Self-pay | Admitting: Occupational Therapy

## 2022-03-04 ENCOUNTER — Ambulatory Visit: Payer: Medicaid Other | Attending: Physician Assistant | Admitting: Occupational Therapy

## 2022-03-04 ENCOUNTER — Other Ambulatory Visit (HOSPITAL_COMMUNITY): Payer: Self-pay

## 2022-03-11 ENCOUNTER — Ambulatory Visit: Payer: Medicaid Other | Admitting: Occupational Therapy

## 2022-03-11 ENCOUNTER — Telehealth: Payer: Self-pay | Admitting: Occupational Therapy

## 2022-03-11 NOTE — Telephone Encounter (Signed)
OT called number in chart for pt's mother as pt does not have active number in chart. Therapist requested call back  as pt has missed 2 back to back visits without contact with clinic. Pt will be discharged if he misses his next visit and/or does not return phone call.

## 2022-03-18 ENCOUNTER — Ambulatory Visit: Payer: Medicaid Other | Admitting: Occupational Therapy

## 2022-03-18 ENCOUNTER — Encounter: Payer: Self-pay | Admitting: Occupational Therapy

## 2022-03-18 NOTE — Therapy (Signed)
  Occupational Therapy Discharge Summary   Patient: Perry Day MRN: [id number] Date of Birth: 10-07-1985  Pt discharged following 3, no-show visits per attendance policy. Will require new order should further therapy services be needed.   Murlean Caller, OTR/L 03/18/2022 [facility] [address] [place], [place], [address] Phone: [phone]   Fax:  [phone]

## 2022-04-29 ENCOUNTER — Other Ambulatory Visit: Payer: Self-pay | Admitting: Pharmacist

## 2022-04-29 ENCOUNTER — Ambulatory Visit (INDEPENDENT_AMBULATORY_CARE_PROVIDER_SITE_OTHER): Payer: Medicaid Other | Admitting: Primary Care

## 2022-04-29 ENCOUNTER — Other Ambulatory Visit (HOSPITAL_COMMUNITY): Payer: Self-pay

## 2022-04-29 ENCOUNTER — Encounter (INDEPENDENT_AMBULATORY_CARE_PROVIDER_SITE_OTHER): Payer: Self-pay | Admitting: Primary Care

## 2022-04-29 VITALS — BP 118/82 | HR 95 | Resp 16 | Ht 70.0 in | Wt 160.2 lb

## 2022-04-29 DIAGNOSIS — Z1159 Encounter for screening for other viral diseases: Secondary | ICD-10-CM

## 2022-04-29 DIAGNOSIS — R569 Unspecified convulsions: Secondary | ICD-10-CM | POA: Diagnosis not present

## 2022-04-29 DIAGNOSIS — Z09 Encounter for follow-up examination after completed treatment for conditions other than malignant neoplasm: Secondary | ICD-10-CM

## 2022-04-29 DIAGNOSIS — Z131 Encounter for screening for diabetes mellitus: Secondary | ICD-10-CM | POA: Diagnosis not present

## 2022-04-29 DIAGNOSIS — D649 Anemia, unspecified: Secondary | ICD-10-CM

## 2022-04-29 DIAGNOSIS — Z7689 Persons encountering health services in other specified circumstances: Secondary | ICD-10-CM

## 2022-04-29 DIAGNOSIS — Z13220 Encounter for screening for lipoid disorders: Secondary | ICD-10-CM

## 2022-04-29 MED ORDER — LEVETIRACETAM 500 MG PO TABS: 500.0000 mg | ORAL_TABLET | Freq: Two times a day (BID) | ORAL | 0 refills | Status: DC
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
## 2022-04-30 LAB — LIPID PANEL
Chol/HDL Ratio: 2.8 ratio (ref 0.0–5.0)
Cholesterol, Total: 149 mg/dL (ref 100–199)
HDL: 53 mg/dL (ref >39)
LDL Chol Calc (NIH): 83 mg/dL (ref 0–99)
Triglycerides: 67 mg/dL (ref 0–149)
VLDL Cholesterol Cal: 13 mg/dL (ref 5–40)

## 2022-04-30 LAB — CMP14+EGFR
ALT: 50 IU/L — ABNORMAL HIGH (ref 0–44)
AST: 57 IU/L — ABNORMAL HIGH (ref 0–40)
Albumin/Globulin Ratio: 2.2 (ref 1.2–2.2)
Albumin: 4.1 g/dL (ref 4.1–5.1)
Alkaline Phosphatase: 78 IU/L (ref 44–121)
BUN/Creatinine Ratio: 7 — ABNORMAL LOW (ref 9–20)
BUN: 7 mg/dL (ref 6–20)
Bilirubin Total: 0.3 mg/dL (ref 0.0–1.2)
CO2: 23 mmol/L (ref 20–29)
Calcium: 8.3 mg/dL — ABNORMAL LOW (ref 8.7–10.2)
Chloride: 104 mmol/L (ref 96–106)
Creatinine, Ser: 0.96 mg/dL (ref 0.76–1.27)
Globulin, Total: 1.9 g/dL (ref 1.5–4.5)
Glucose: 70 mg/dL (ref 70–99)
Potassium: 4.5 mmol/L (ref 3.5–5.2)
Sodium: 144 mmol/L (ref 134–144)
Total Protein: 6 g/dL (ref 6.0–8.5)
eGFR: 106 mL/min/{1.73_m2} (ref >59)

## 2022-04-30 LAB — IRON,TIBC AND FERRITIN PANEL
Ferritin: 39 ng/mL (ref 30–400)
Iron Saturation: 3 % — CL (ref 15–55)
Iron: 13 ug/dL — ABNORMAL LOW (ref 38–169)
Total Iron Binding Capacity: 401 ug/dL (ref 250–450)
UIBC: 388 ug/dL — ABNORMAL HIGH (ref 111–343)

## 2022-04-30 LAB — CBC WITH DIFFERENTIAL/PLATELET
Basophils Absolute: 0 10*3/uL (ref 0.0–0.2)
Basos: 1 %
EOS (ABSOLUTE): 0.1 10*3/uL (ref 0.0–0.4)
Eos: 3 %
Hematocrit: 43.7 % (ref 37.5–51.0)
Hemoglobin: 13.4 g/dL (ref 13.0–17.7)
Immature Grans (Abs): 0 10*3/uL (ref 0.0–0.1)
Immature Granulocytes: 0 %
Lymphocytes Absolute: 1.5 10*3/uL (ref 0.7–3.1)
Lymphs: 41 %
MCH: 23.2 pg — ABNORMAL LOW (ref 26.6–33.0)
MCHC: 30.7 g/dL — ABNORMAL LOW (ref 31.5–35.7)
MCV: 76 fL — ABNORMAL LOW (ref 79–97)
Monocytes Absolute: 0.5 10*3/uL (ref 0.1–0.9)
Monocytes: 13 %
Neutrophils Absolute: 1.6 10*3/uL (ref 1.4–7.0)
Neutrophils: 42 %
Platelets: 204 10*3/uL (ref 150–450)
RBC: 5.78 x10E6/uL (ref 4.14–5.80)
RDW: 18.8 % — ABNORMAL HIGH (ref 11.6–15.4)
WBC: 3.7 10*3/uL (ref 3.4–10.8)

## 2022-04-30 LAB — HCV INTERPRETATION

## 2022-04-30 LAB — HCV AB W REFLEX TO QUANT PCR: HCV Ab: NONREACTIVE

## 2022-04-30 LAB — HEMOGLOBIN A1C
Est. average glucose Bld gHb Est-mCnc: 111 mg/dL
Hgb A1c MFr Bld: 5.5 % (ref 4.8–5.6)

## 2022-05-01 ENCOUNTER — Other Ambulatory Visit (INDEPENDENT_AMBULATORY_CARE_PROVIDER_SITE_OTHER): Payer: Self-pay | Admitting: Primary Care

## 2022-05-01 DIAGNOSIS — D649 Anemia, unspecified: Secondary | ICD-10-CM

## 2022-07-01 ENCOUNTER — Emergency Department (HOSPITAL_COMMUNITY)
Admission: EM | Admit: 2022-07-01 | Discharge: 2022-07-02 | Disposition: A | Discharge status: Home / Self Care | Payer: Medicaid Other | Attending: Emergency Medicine | Admitting: Emergency Medicine

## 2022-07-01 ENCOUNTER — Other Ambulatory Visit: Payer: Self-pay

## 2022-07-01 DIAGNOSIS — D72819 Decreased white blood cell count, unspecified: Secondary | ICD-10-CM | POA: Diagnosis not present

## 2022-07-01 DIAGNOSIS — R7989 Other specified abnormal findings of blood chemistry: Secondary | ICD-10-CM | POA: Insufficient documentation

## 2022-07-01 DIAGNOSIS — D649 Anemia, unspecified: Secondary | ICD-10-CM | POA: Insufficient documentation

## 2022-07-01 DIAGNOSIS — G40909 Epilepsy, unspecified, not intractable, without status epilepticus: Secondary | ICD-10-CM | POA: Insufficient documentation

## 2022-07-01 DIAGNOSIS — Z7901 Long term (current) use of anticoagulants: Secondary | ICD-10-CM | POA: Insufficient documentation

## 2022-07-01 DIAGNOSIS — R569 Unspecified convulsions: Secondary | ICD-10-CM | POA: Diagnosis not present

## 2022-07-01 MED ORDER — SODIUM CHLORIDE 0.9 % IV SOLN
4000.0000 mg | Freq: Once | INTRAVENOUS | Status: AC
  Administered 2022-07-02: 4000 mg via INTRAVENOUS
  Filled 2022-07-01: qty 40

## 2022-07-01 NOTE — ED Triage Notes (Signed)
Pt arrives via [facility] from home d/t feeling like he is going to have a seizure. Denies seizures. Sts he's hasn't been able to pick up his Keppra. Has not take it past 3-4 days. Otherwise per usual state of health.

## 2022-07-02 ENCOUNTER — Emergency Department (HOSPITAL_COMMUNITY): Payer: Medicaid Other

## 2022-07-02 DIAGNOSIS — R569 Unspecified convulsions: Secondary | ICD-10-CM | POA: Diagnosis not present

## 2022-07-02 LAB — CBC WITH DIFFERENTIAL/PLATELET
Abs Immature Granulocytes: 0 10*3/uL (ref 0.00–0.07)
Basophils Absolute: 0 10*3/uL (ref 0.0–0.1)
Basophils Relative: 1 %
Eosinophils Absolute: 0.1 10*3/uL (ref 0.0–0.5)
Eosinophils Relative: 4 %
HCT: 39.3 % (ref 39.0–52.0)
Hemoglobin: 12.1 g/dL — ABNORMAL LOW (ref 13.0–17.0)
Lymphocytes Relative: 61 %
Lymphs Abs: 1.8 10*3/uL (ref 0.7–4.0)
MCH: 25.1 pg — ABNORMAL LOW (ref 26.0–34.0)
MCHC: 30.8 g/dL (ref 30.0–36.0)
MCV: 81.4 fL (ref 80.0–100.0)
Monocytes Absolute: 0.2 10*3/uL (ref 0.1–1.0)
Monocytes Relative: 5 %
Neutro Abs: 0.9 10*3/uL — ABNORMAL LOW (ref 1.7–7.7)
Neutrophils Relative %: 29 %
Platelets: 180 10*3/uL (ref 150–400)
RBC: 4.83 MIL/uL (ref 4.22–5.81)
RDW: 16.7 % — ABNORMAL HIGH (ref 11.5–15.5)
WBC: 3 10*3/uL — ABNORMAL LOW (ref 4.0–10.5)
nRBC: 0 % (ref 0.0–0.2)
nRBC: 0 /100 WBC

## 2022-07-02 LAB — I-STAT CHEM 8, ED
BUN: 7 mg/dL (ref 6–20)
Calcium, Ion: 1.11 mmol/L — ABNORMAL LOW (ref 1.15–1.40)
Chloride: 104 mmol/L (ref 98–111)
Creatinine, Ser: 1.4 mg/dL — ABNORMAL HIGH (ref 0.61–1.24)
Glucose, Bld: 86 mg/dL (ref 70–99)
HCT: 38 % — ABNORMAL LOW (ref 39.0–52.0)
Hemoglobin: 12.9 g/dL — ABNORMAL LOW (ref 13.0–17.0)
Potassium: 4.1 mmol/L (ref 3.5–5.1)
Sodium: 141 mmol/L (ref 135–145)
TCO2: 27 mmol/L (ref 22–32)

## 2022-07-02 LAB — PATHOLOGIST SMEAR REVIEW

## 2022-07-02 MED ORDER — LEVETIRACETAM 500 MG PO TABS: 500.0000 mg | ORAL_TABLET | Freq: Two times a day (BID) | ORAL | 0 refills | Status: DC

## 2022-07-02 NOTE — ED Provider Notes (Signed)
[place] EMERGENCY DEPARTMENT AT [facility] Provider Note   CSN: [id number] Arrival date & time: 07/01/22  2337     History  Chief Complaint  Patient presents with   Seizures    Perry Day is a 37 y.o. male.  The history is provided by the patient.  Seizures Seizure activity on arrival: no   Preceding symptoms: no sensation of an aura present   Initial focality:  None Episode characteristics: generalized shaking   Postictal symptoms: no confusion   Return to baseline: yes   Severity:  Mild Progression:  Resolved Context: medical non-compliance   Context: not alcohol withdrawal   Recent head injury:  No recent head injuries PTA treatment:  None History of seizures: yes   Patient with seizures who came in for seizure as has not filled his keppra.      Past Medical History:  Diagnosis Date   DVT (deep venous thrombosis) (HCC)    H/O skin graft    Seizures (HCC)    TBI (traumatic brain injury) (HCC)      Home Medications Prior to Admission medications   Medication Sig Start Date End Date Taking? Authorizing Provider  levETIRAcetam (KEPPRA) 500 MG tablet Take 1 tablet (500 mg total) by mouth 2 (two) times daily. 07/02/22  Yes Kenyan Karnes, MD  acetaminophen (TYLENOL) 500 MG tablet Take 2 tablets (1,000 mg total) by mouth every 6 (six) hours as needed for mild pain. 01/06/22   Meuth, Brooke A, PA-C  ondansetron (ZOFRAN-ODT) 8 MG disintegrating tablet Take 1 tablet (8 mg total) by mouth every 8 (eight) hours as needed for nausea or vomiting. 02/23/22   Dione Booze, MD  potassium chloride SA (KLOR-CON M) 20 MEQ tablet Take 1 tablet (20 mEq total) by mouth 2 (two) times daily. 02/23/22   Dione Booze, MD  APIXABAN Everlene Balls) VTE STARTER PACK (10MG  AND 5MG ) Take as directed on package: start with two-5mg  tablets twice daily for 7 days. On day 8, switch to one-5mg  tablet twice daily. 04/26/20 04/26/20  Gailen Shelter, PA      Allergies    Patient has no  known allergies.    Review of Systems   Review of Systems  Constitutional:  Negative for fever.  HENT:  Negative for dental problem.   Neurological:  Positive for seizures.  All other systems reviewed and are negative.   Physical Exam Updated Vital Signs BP 117/89 (BP Location: Right Arm)   Pulse 73   Temp 98.2 F (36.8 C) (Oral)   Resp 17   Ht 5\' 10"  (1.778 m)   Wt 72.7 kg   SpO2 98%   BMI 23.00 kg/m  Physical Exam Vitals and nursing note reviewed.  Constitutional:      General: He is not in acute distress.    Appearance: He is well-developed. He is not diaphoretic.  HENT:     Head: Normocephalic and atraumatic.     Nose: Nose normal.  Eyes:     Conjunctiva/sclera: Conjunctivae normal.     Pupils: Pupils are equal, round, and reactive to light.  Cardiovascular:     Rate and Rhythm: Normal rate and regular rhythm.  Pulmonary:     Effort: Pulmonary effort is normal.     Breath sounds: Normal breath sounds. No wheezing or rales.  Abdominal:     General: Bowel sounds are normal.     Palpations: Abdomen is soft.     Tenderness: There is no abdominal tenderness. There is no  guarding or rebound.  Musculoskeletal:        General: Normal range of motion.     Cervical back: Normal range of motion and neck supple.  Skin:    General: Skin is warm and dry.     Capillary Refill: Capillary refill takes less than 2 seconds.  Neurological:     General: No focal deficit present.     Mental Status: He is alert and oriented to person, place, and time.  Psychiatric:        Mood and Affect: Mood normal.     ED Results / Procedures / Treatments   Labs (all labs ordered are listed, but only abnormal results are displayed) Results for orders placed or performed during the hospital encounter of 07/01/22  CBC with Differential  Result Value Ref Range   WBC 3.0 (L) 4.0 - 10.5 K/uL   RBC 4.83 4.22 - 5.81 MIL/uL   Hemoglobin 12.1 (L) 13.0 - 17.0 g/dL   HCT 09.8 11.9 - 14.7 %    MCV 81.4 80.0 - 100.0 fL   MCH 25.1 (L) 26.0 - 34.0 pg   MCHC 30.8 30.0 - 36.0 g/dL   RDW 82.9 (H) 56.2 - 13.0 %   Platelets 180 150 - 400 K/uL   nRBC 0.0 0.0 - 0.2 %   Neutrophils Relative % 29 %   Neutro Abs 0.9 (L) 1.7 - 7.7 K/uL   Lymphocytes Relative 61 %   Lymphs Abs 1.8 0.7 - 4.0 K/uL   Monocytes Relative 5 %   Monocytes Absolute 0.2 0.1 - 1.0 K/uL   Eosinophils Relative 4 %   Eosinophils Absolute 0.1 0.0 - 0.5 K/uL   Basophils Relative 1 %   Basophils Absolute 0.0 0.0 - 0.1 K/uL   nRBC 0 0 /100 WBC   Abs Immature Granulocytes 0.00 0.00 - 0.07 K/uL   Polychromasia PRESENT   I-stat chem 8, ED (not at [facility], [facility] or [facility])  Result Value Ref Range   Sodium 141 135 - 145 mmol/L   Potassium 4.1 3.5 - 5.1 mmol/L   Chloride 104 98 - 111 mmol/L   BUN 7 6 - 20 mg/dL   Creatinine, Ser 8.65 (H) 0.61 - 1.24 mg/dL   Glucose, Bld 86 70 - 99 mg/dL   Calcium, Ion 7.84 (L) 1.15 - 1.40 mmol/L   TCO2 27 22 - 32 mmol/L   Hemoglobin 12.9 (L) 13.0 - 17.0 g/dL   HCT 69.6 (L) 29.5 - 28.4 %   CT Head Wo Contrast  Result Date: 07/02/2022 CLINICAL DATA:  Feels like he is going to have a seizure EXAM: CT HEAD WITHOUT CONTRAST TECHNIQUE: Contiguous axial images were obtained from the base of the skull through the vertex without intravenous contrast. RADIATION DOSE REDUCTION: This exam was performed according to the departmental dose-optimization program which includes automated exposure control, adjustment of the mA and/or kV according to patient size and/or use of iterative reconstruction technique. COMPARISON:  01/05/2022 FINDINGS: Brain: No evidence of acute infarction, hemorrhage, mass, mass effect, or midline shift. No hydrocephalus or extra-axial fluid collection. Redemonstrated right inferior frontal and temporal encephalomalacia. Vascular: No hyperdense vessel. Skull: Negative for fracture or focal lesion. Sinuses/Orbits: Mucosal thickening in the maxillary sinuses, ethmoid air cells, and frontal  sinuses. No acute finding in the orbits. Other: The mastoid air cells are well aerated. IMPRESSION: No acute intracranial process. Electronically Signed   By: Wiliam Ke M.D.   On: 07/02/2022 01:07    EKG None  Radiology CT Head Wo Contrast  Result Date: 07/02/2022 CLINICAL DATA:  Feels like he is going to have a seizure EXAM: CT HEAD WITHOUT CONTRAST TECHNIQUE: Contiguous axial images were obtained from the base of the skull through the vertex without intravenous contrast. RADIATION DOSE REDUCTION: This exam was performed according to the departmental dose-optimization program which includes automated exposure control, adjustment of the mA and/or kV according to patient size and/or use of iterative reconstruction technique. COMPARISON:  01/05/2022 FINDINGS: Brain: No evidence of acute infarction, hemorrhage, mass, mass effect, or midline shift. No hydrocephalus or extra-axial fluid collection. Redemonstrated right inferior frontal and temporal encephalomalacia. Vascular: No hyperdense vessel. Skull: Negative for fracture or focal lesion. Sinuses/Orbits: Mucosal thickening in the maxillary sinuses, ethmoid air cells, and frontal sinuses. No acute finding in the orbits. Other: The mastoid air cells are well aerated. IMPRESSION: No acute intracranial process. Electronically Signed   By: Wiliam Ke M.D.   On: 07/02/2022 01:07    Procedures Procedures    Medications Ordered in ED Medications  levETIRAcetam (KEPPRA) 4,000 mg in sodium chloride 0.9 % 250 mL IVPB (4,000 mg Intravenous New Bag/Given 07/02/22 0030)    ED Course/ Medical Decision Making/ A&P                             Medical Decision Making Seizure with disorder has not filled keppra   Amount and/or Complexity of Data Reviewed Independent Historian: EMS    Details: See above  External Data Reviewed: notes.    Details: Previous notes reviewed  Labs: ordered.    Details: All labs reviewed: white count low 3, hemoglobin  slight low 12.3, normal platelets 180. Sodium normal 141, normal potassium 4.1, slight elevation in creatinine 1.4  Radiology: ordered and independent interpretation performed. Decision-making details documented in ED Course.    Details: Negative head CT by   Risk Prescription drug management. Risk Details: Well appearing.  No further seizures.  Reloaded with keppra.  Database administrator for Keppra.  No driving for six months or until cleared by neurology.  Patient verbalizes understanding of this.      Final Clinical Impression(s) / ED Diagnoses Final diagnoses:  Seizure disorder (HCC)   Return for intractable cough, coughing up blood, fevers > 100.4 unrelieved by medication, shortness of breath, intractable vomiting, chest pain, shortness of breath, weakness, numbness, changes in speech, facial asymmetry, abdominal pain, passing out, Inability to tolerate liquids or food, cough, altered mental status or any concerns. No signs of systemic illness or infection. The patient is nontoxic-appearing on exam and vital signs are within normal limits.  I have reviewed the triage vital signs and the nursing notes. Pertinent labs & imaging results that were available during my care of the patient were reviewed by me and considered in my medical decision making (see chart for details). After history, exam, and medical workup I feel the patient has been appropriately medically screened and is safe for discharge home. Pertinent diagnoses were discussed with the patient. Patient was given return precautions.  Rx / DC Orders ED Discharge Orders          Ordered    levETIRAcetam (KEPPRA) 500 MG tablet  2 times daily        07/02/22 0120              Kaliann Coryell, MD 07/02/22 0130

## 2022-07-20 ENCOUNTER — Encounter (HOSPITAL_COMMUNITY): Payer: Self-pay | Admitting: *Deleted

## 2022-07-20 ENCOUNTER — Other Ambulatory Visit: Payer: Self-pay

## 2022-07-20 ENCOUNTER — Emergency Department (HOSPITAL_COMMUNITY): Payer: 59

## 2022-07-20 ENCOUNTER — Emergency Department (HOSPITAL_COMMUNITY)
Admission: EM | Admit: 2022-07-20 | Discharge: 2022-07-20 | Disposition: A | Discharge status: Home / Self Care | Payer: 59 | Attending: Emergency Medicine | Admitting: Emergency Medicine

## 2022-07-20 DIAGNOSIS — E876 Hypokalemia: Secondary | ICD-10-CM | POA: Insufficient documentation

## 2022-07-20 DIAGNOSIS — R110 Nausea: Secondary | ICD-10-CM | POA: Diagnosis not present

## 2022-07-20 DIAGNOSIS — R569 Unspecified convulsions: Secondary | ICD-10-CM | POA: Diagnosis not present

## 2022-07-20 DIAGNOSIS — F1210 Cannabis abuse, uncomplicated: Secondary | ICD-10-CM | POA: Diagnosis not present

## 2022-07-20 DIAGNOSIS — R0602 Shortness of breath: Secondary | ICD-10-CM | POA: Insufficient documentation

## 2022-07-20 DIAGNOSIS — R112 Nausea with vomiting, unspecified: Secondary | ICD-10-CM | POA: Diagnosis not present

## 2022-07-20 DIAGNOSIS — Z7901 Long term (current) use of anticoagulants: Secondary | ICD-10-CM | POA: Diagnosis not present

## 2022-07-20 DIAGNOSIS — R519 Headache, unspecified: Secondary | ICD-10-CM | POA: Diagnosis not present

## 2022-07-20 DIAGNOSIS — R1111 Vomiting without nausea: Secondary | ICD-10-CM | POA: Diagnosis not present

## 2022-07-20 DIAGNOSIS — R4182 Altered mental status, unspecified: Secondary | ICD-10-CM | POA: Diagnosis not present

## 2022-07-20 LAB — URINALYSIS, ROUTINE W REFLEX MICROSCOPIC
Bilirubin Urine: NEGATIVE
Glucose, UA: NEGATIVE mg/dL
Hgb urine dipstick: NEGATIVE
Ketones, ur: 5 mg/dL — AB
Leukocytes,Ua: NEGATIVE
Nitrite: NEGATIVE
Protein, ur: NEGATIVE mg/dL
Specific Gravity, Urine: 1.015 (ref 1.005–1.030)
pH: 5 (ref 5.0–8.0)

## 2022-07-20 LAB — CBC WITH DIFFERENTIAL/PLATELET
Abs Immature Granulocytes: 0.02 10*3/uL (ref 0.00–0.07)
Basophils Absolute: 0 10*3/uL (ref 0.0–0.1)
Basophils Relative: 1 %
Eosinophils Absolute: 0.1 10*3/uL (ref 0.0–0.5)
Eosinophils Relative: 1 %
HCT: 45.3 % (ref 39.0–52.0)
Hemoglobin: 13.8 g/dL (ref 13.0–17.0)
Immature Granulocytes: 0 %
Lymphocytes Relative: 36 %
Lymphs Abs: 1.8 10*3/uL (ref 0.7–4.0)
MCH: 24 pg — ABNORMAL LOW (ref 26.0–34.0)
MCHC: 30.5 g/dL (ref 30.0–36.0)
MCV: 78.8 fL — ABNORMAL LOW (ref 80.0–100.0)
Monocytes Absolute: 0.6 10*3/uL (ref 0.1–1.0)
Monocytes Relative: 11 %
Neutro Abs: 2.5 10*3/uL (ref 1.7–7.7)
Neutrophils Relative %: 51 %
Platelets: 237 10*3/uL (ref 150–400)
RBC: 5.75 MIL/uL (ref 4.22–5.81)
RDW: 15.1 % (ref 11.5–15.5)
WBC: 5 10*3/uL (ref 4.0–10.5)
nRBC: 0 % (ref 0.0–0.2)

## 2022-07-20 LAB — COMPREHENSIVE METABOLIC PANEL
ALT: 78 U/L — ABNORMAL HIGH (ref 0–44)
AST: 97 U/L — ABNORMAL HIGH (ref 15–41)
Albumin: 3.6 g/dL (ref 3.5–5.0)
Alkaline Phosphatase: 97 U/L (ref 38–126)
Anion gap: 17 — ABNORMAL HIGH (ref 5–15)
BUN: 6 mg/dL (ref 6–20)
CO2: 23 mmol/L (ref 22–32)
Calcium: 8.3 mg/dL — ABNORMAL LOW (ref 8.9–10.3)
Chloride: 99 mmol/L (ref 98–111)
Creatinine, Ser: 1.18 mg/dL (ref 0.61–1.24)
GFR, Estimated: >60 mL/min (ref >60)
Glucose, Bld: 81 mg/dL (ref 70–99)
Potassium: 3.3 mmol/L — ABNORMAL LOW (ref 3.5–5.1)
Sodium: 139 mmol/L (ref 135–145)
Total Bilirubin: 0.5 mg/dL (ref 0.3–1.2)
Total Protein: 6.3 g/dL — ABNORMAL LOW (ref 6.5–8.1)

## 2022-07-20 LAB — ETHANOL: Alcohol, Ethyl (B): 39 mg/dL — ABNORMAL HIGH (ref <10)

## 2022-07-20 LAB — RAPID URINE DRUG SCREEN, HOSP PERFORMED
Amphetamines: NOT DETECTED
Barbiturates: NOT DETECTED
Benzodiazepines: NOT DETECTED
Cocaine: NOT DETECTED
Opiates: NOT DETECTED
Tetrahydrocannabinol: POSITIVE — AB

## 2022-07-20 LAB — TROPONIN I (HIGH SENSITIVITY)
Troponin I (High Sensitivity): 13 ng/L (ref <18)
Troponin I (High Sensitivity): 13 ng/L (ref <18)

## 2022-07-20 LAB — LIPASE, BLOOD: Lipase: 27 U/L (ref 11–51)

## 2022-07-20 MED ORDER — THIAMINE HCL 100 MG/ML IJ SOLN: 100.0000 mg | Freq: Every day | INTRAMUSCULAR | Status: DC

## 2022-07-20 MED ORDER — POTASSIUM CHLORIDE CRYS ER 20 MEQ PO TBCR
20.0000 meq | EXTENDED_RELEASE_TABLET | Freq: Once | ORAL | Status: AC
  Administered 2022-07-20: 20 meq via ORAL
  Filled 2022-07-20: qty 1

## 2022-07-20 MED ORDER — LEVETIRACETAM 500 MG PO TABS
500.0000 mg | ORAL_TABLET | Freq: Once | ORAL | Status: AC
  Administered 2022-07-20: 500 mg via ORAL
  Filled 2022-07-20: qty 1

## 2022-07-20 MED ORDER — LORAZEPAM 1 MG PO TABS: 1.0000 mg | ORAL_TABLET | ORAL | Status: DC | PRN

## 2022-07-20 MED ORDER — LORAZEPAM 2 MG/ML IJ SOLN: 1.0000 mg | INTRAMUSCULAR | Status: DC | PRN

## 2022-07-20 MED ORDER — THIAMINE MONONITRATE 100 MG PO TABS
100.0000 mg | ORAL_TABLET | Freq: Every day | ORAL | Status: DC
  Administered 2022-07-20: 100 mg via ORAL
  Filled 2022-07-20: qty 1

## 2022-07-20 MED ORDER — FOLIC ACID 1 MG PO TABS
1.0000 mg | ORAL_TABLET | Freq: Every day | ORAL | Status: DC
  Administered 2022-07-20: 1 mg via ORAL
  Filled 2022-07-20: qty 1

## 2022-07-20 MED ORDER — ADULT MULTIVITAMIN W/MINERALS CH
1.0000 | ORAL_TABLET | Freq: Every day | ORAL | Status: DC
  Administered 2022-07-20: 1 via ORAL
  Filled 2022-07-20: qty 1

## 2022-07-20 MED ORDER — ONDANSETRON 4 MG PO TBDP
4.0000 mg | ORAL_TABLET | Freq: Once | ORAL | Status: DC
  Filled 2022-07-20: qty 1

## 2022-07-20 NOTE — ED Notes (Signed)
Last keppra yesterday am

## 2022-07-20 NOTE — ED Triage Notes (Signed)
BIB [facility] from home for sob. No respiratory issues or distress noted. 98% RA. Pt verbalizes smoked weed and had seafood last night. States, they must have put cocaine in the weed, it tasted different. Endorses woke up this morning feeling weird, fatigued, NV (Vx3), and shaky tremulous. Alert, NAD, shaky, interactive. VSS. CBG 85. Last sz was 2 weeks ago. Takes Keppra. Zofran 4mg  IM given PTA. Smells of ETOH.

## 2022-07-20 NOTE — ED Provider Triage Note (Addendum)
Emergency Medicine Provider Triage Evaluation Note  Perry Day , a 37 y.o. male  was evaluated in triage.  Pt complains of shortness of breath. When EMS arived her reports that he thinks someone put cocaine in his weed. He is alert and communicative but is oriented to person and place. He thinks it is 2016 or 2017. He does have a h/o seizures, but doesn't know if he had one. EMS denied any tremulous activity. He reports he drinks "a lot". Reports MJ use but no other drug use. He currently has no complaints with me.   Review of Systems  Positive:  Negative:   Physical Exam  BP (!) 132/96 (BP Location: Right Arm)   Pulse 76   Temp 98.3 F (36.8 C) (Oral)   Resp (!) 22   Wt 72.6 kg   SpO2 98% Comment: Simultaneous filing. User may not have seen previous data.  BMI 22.96 kg/m  Gen:   Awake Resp:  Normal effort  MSK:   Moves extremities without difficulty  Other:  Patient ambulatory. Appears to be mildly aggressive. Moving all extremitiies. Confused. Midly diaphoretic.   Medical Decision Making  Medically screening exam initiated at 3:21 PM.  Appropriate orders placed.  Audi Puello was informed that the remainder of the evaluation will be completed by another provider, this initial triage assessment does not replace that evaluation, and the importance of remaining in the ED until their evaluation is complete.  Question post-ictal state given patient's h/o seizures. Will order CIWA protocol as well. Head CT and labs ordered.    Achille Rich, PA-C 07/20/22 1524    Achille Rich, PA-C 07/20/22 1525

## 2022-07-20 NOTE — Discharge Instructions (Addendum)
It was a pleasure caring for you today.  Drug screen was only positive for cannabis and alcohol.  About blood workup reassuring.  Seek emergency care if experiencing any new or worsening symptoms.

## 2022-07-20 NOTE — ED Notes (Signed)
Urine obtained.

## 2022-07-20 NOTE — ED Provider Notes (Signed)
[place] EMERGENCY DEPARTMENT AT [facility] Provider Note   CSN: [id number] Arrival date & time: 07/20/22  1439     History {Add pertinent medical, surgical, social history, OB history to HPI:1} Chief Complaint  Patient presents with   Shortness of Breath    Perry Day is a 37 y.o. male with PMHx seizures who presents to ED complaining of SOB, chest pain, non-bloody emesis (x3) and headache that started last night after patient smoked cannabis that he thinks was "laced with cocaine". Patient only complaining of headache in ED. States his last seizure was 2-3 weeks ago and is complaint on Keppra.  Currently denying chest pain, dyspnea, cough, abdominal pain, nausea, vomiting, diarrhea. Denies hemoptysis, recent surgery/trauma, prior PE/DVT.   Shortness of Breath      Home Medications Prior to Admission medications   Medication Sig Start Date End Date Taking? Authorizing Provider  acetaminophen (TYLENOL) 500 MG tablet Take 2 tablets (1,000 mg total) by mouth every 6 (six) hours as needed for mild pain. 01/06/22   Meuth, Brooke A, PA-C  levETIRAcetam (KEPPRA) 500 MG tablet Take 1 tablet (500 mg total) by mouth 2 (two) times daily. 07/02/22   Palumbo, April, MD  ondansetron (ZOFRAN-ODT) 8 MG disintegrating tablet Take 1 tablet (8 mg total) by mouth every 8 (eight) hours as needed for nausea or vomiting. 02/23/22   Dione Booze, MD  potassium chloride SA (KLOR-CON M) 20 MEQ tablet Take 1 tablet (20 mEq total) by mouth 2 (two) times daily. 02/23/22   Dione Booze, MD  APIXABAN Everlene Balls) VTE STARTER PACK (10MG  AND 5MG ) Take as directed on package: start with two-5mg  tablets twice daily for 7 days. On day 8, switch to one-5mg  tablet twice daily. 04/26/20 04/26/20  Gailen Shelter, PA      Allergies    Patient has no known allergies.    Review of Systems   Review of Systems  Respiratory:  Positive for shortness of breath.     Physical Exam Updated Vital Signs BP (!)  132/96 (BP Location: Right Arm)   Pulse 76   Temp 98.3 F (36.8 C) (Oral)   Resp (!) 22   Wt 72.6 kg   SpO2 98% Comment: Simultaneous filing. User may not have seen previous data.  BMI 22.96 kg/m  Physical Exam Vitals and nursing note reviewed.  Constitutional:      General: He is not in acute distress.    Appearance: He is not ill-appearing or toxic-appearing.  HENT:     Head: Normocephalic and atraumatic.     Mouth/Throat:     Mouth: Mucous membranes are moist.     Pharynx: No posterior oropharyngeal erythema.  Eyes:     General: No scleral icterus.       Right eye: No discharge.        Left eye: No discharge.     Conjunctiva/sclera: Conjunctivae normal.  Cardiovascular:     Rate and Rhythm: Normal rate and regular rhythm.     Pulses: Normal pulses.     Heart sounds: No murmur heard.    Comments: +2 radial and pedal pulses Pulmonary:     Effort: Pulmonary effort is normal. No respiratory distress.     Breath sounds: Normal breath sounds. No wheezing, rhonchi or rales.  Abdominal:     Tenderness: There is no abdominal tenderness.  Musculoskeletal:     Right lower leg: No edema.     Left lower leg: No edema.  Skin:  General: Skin is warm and dry.     Findings: No rash.  Neurological:     General: No focal deficit present.     Mental Status: He is alert and oriented to person, place, and time. Mental status is at baseline.  Psychiatric:        Mood and Affect: Mood normal.     ED Results / Procedures / Treatments   Labs (all labs ordered are listed, but only abnormal results are displayed) Labs Reviewed  ETHANOL - Abnormal; Notable for the following components:      Result Value   Alcohol, Ethyl (B) 39 (*)    All other components within normal limits  CBC WITH DIFFERENTIAL/PLATELET - Abnormal; Notable for the following components:   MCV 78.8 (*)    MCH 24.0 (*)    All other components within normal limits  URINALYSIS, ROUTINE W REFLEX MICROSCOPIC -  Abnormal; Notable for the following components:   Ketones, ur 5 (*)    All other components within normal limits  COMPREHENSIVE METABOLIC PANEL  RAPID URINE DRUG SCREEN, HOSP PERFORMED  LIPASE, BLOOD  LEVETIRACETAM LEVEL  TROPONIN I (HIGH SENSITIVITY)    EKG None  Radiology No results found.  Procedures Procedures  {Document cardiac monitor, telemetry assessment procedure when appropriate:1}  Medications Ordered in ED Medications  LORazepam (ATIVAN) tablet 1-4 mg (has no administration in time range)    Or  LORazepam (ATIVAN) injection 1-4 mg (has no administration in time range)  thiamine (VITAMIN B1) tablet 100 mg (has no administration in time range)    Or  thiamine (VITAMIN B1) injection 100 mg (has no administration in time range)  folic acid (FOLVITE) tablet 1 mg (has no administration in time range)  multivitamin with minerals tablet 1 tablet (has no administration in time range)    ED Course/ Medical Decision Making/ A&P   {   Click here for ABCD2, HEART and other calculatorsREFRESH Note before signing :1}                          Medical Decision Making  This patient presents to the ED for concern of shortness of breath, this involves an extensive number of treatment options, and is a complaint that carries with it a high risk of complications and morbidity.  The differential diagnosis includes Anxiety, Anaphylaxis/Angioedema, Aspirated FB, Arrhythmia, CHF, Asthma, COPD, PNA, COVID/Flu/RSV, STEMI, Tamponade, TPNX, DKA, Sepsis, Toxin   Co morbidities that complicate the patient evaluation  Seizure; cannabis use     Lab Tests:  I Ordered, and personally interpreted labs.  The pertinent results include:   - CMP: mild hypokalemia; mildly elevated liver enzymes; no concern for kidney damage - Trop: initial and repeat within normal limits - CBC: No concern for anemia or leukocytosis -ETOH: 39 -UA: no concern for infection -Lipase: within normal  limits -UDS: positive for cannabis -Levetiracetam level:   Imaging Studies ordered:  I ordered imaging studies including  -Chest xray: to assess for acute disease that would cause patient's symptoms I independently visualized and interpreted imaging I agree with the radiologist interpretation   Cardiac Monitoring: / EKG:  The patient was maintained on a cardiac monitor.  I personally viewed and interpreted the cardiac monitored which showed an underlying rhythm of: sinus rhythm without acute ST changes or arrhythmias   Consultations Obtained:  I requested consultation with the ***,  and discussed lab and imaging findings as well as pertinent plan -  they recommend: ***   Problem List / ED Course / Critical interventions / Medication management  Provided patient with home Keppra dose. Patient asking to go home. I ordered medication including ***  for ***  Reevaluation of the patient after these medicines showed that the patient {resolved/improved/worsened:23923::"improved"} I have reviewed the patients home medicines and have made adjustments as needed Patient was given return precautions. Patient stable for discharge at this time. Patient verbalized understanding of plan.  DDx: These are considered less likely due to history of present illness and physical exam findings Anaphylaxis/Angioedema: Aspirated FB: no history of choking Arrhythmia: EKG with Asthma/COPD/PNA/COVID/Flu/RSV: Lungs clear to auscultation bilaterally and O2 sat 98% STEMI: Tamponade: CHF: no physical exam findings TPNX: Lungs clear to auscultation bilaterally DKA: Sepsis: afebrile and other vital signs stable Toxin:  Risk Stratification Score:  PERC *** Wells SIRS   Social Determinants of Health:  ***   Test / Admission - Considered:  ***   {Document critical care time when appropriate:1} {Document review of labs and clinical decision tools ie heart score, Chads2Vasc2 etc:1}  {Document  your independent review of radiology images, and any outside records:1} {Document your discussion with family members, caretakers, and with consultants:1} {Document social determinants of health affecting pt's care:1} {Document your decision making why or why not admission, treatments were needed:1} Final Clinical Impression(s) / ED Diagnoses Final diagnoses:  None    Rx / DC Orders ED Discharge Orders     None

## 2022-07-20 NOTE — ED Notes (Signed)
EDPA at BS in triage. 

## 2022-07-20 NOTE — TOC CAGE-AID Note (Signed)
Transition of Care [facility]) - CAGE-AID Screening   Patient Details  Name: Perry Day MRN: [id number] Date of Birth: [birth year]  Transition of Care [facility]) CM/SW Contact:    Amando Chaput C Tarpley-Carter, LCSWA Phone Number: 07/20/2022, 4:24 PM   Clinical Narrative: Pt participated in Cage-Aid.  Pt stated he does drink alcohol socially.  Pt was offered resources, due to usage of ETOH.  Pt states he came to the emergency room because of a reaction he had to smoking marijuana.  He stated that after he smoked the marijuana he started feeling weird and had never experienced that before.  He stated he has smoked marijuana before but it even had a funny taste to it.  Pt stated he will not be hanging out with those friends anymore.  CSW offered resources to pt, pt stated he doesn't use substance or ETOH on a daily bases so no thank you.  CSW insisted that if the pt has any other concerns or questions to please ask for SW.  Pt agreed and thanked CSW.  Kadi Hession Tarpley-Carter, MSW, LCSW-A Pronouns:  She/Her/Hers [facility] HealthTransitions of Care Clinical Social Worker Direct Number:  [phone] [email or web address]     CAGE-AID Screening:    Have You Ever Felt You Ought to Cut Down on Your Drinking or Drug Use?: Yes Have People Annoyed You By [employer] Your Drinking Or Drug Use?: Yes Have You Felt Bad Or Guilty About Your Drinking Or Drug Use?: No Have You Ever Had a Drink or Used Drugs First Thing In The Morning to Steady Your Nerves or to Get Rid of a Hangover?: No CAGE-AID Score: 2  Substance Abuse Education Offered: Yes  Substance abuse interventions: Transport planner

## 2022-07-22 LAB — LEVETIRACETAM LEVEL: Levetiracetam Lvl: <2 ug/mL — ABNORMAL LOW (ref 10.0–40.0)

## 2022-08-03 ENCOUNTER — Emergency Department (HOSPITAL_COMMUNITY): Payer: 59

## 2022-08-03 ENCOUNTER — Other Ambulatory Visit: Payer: Self-pay

## 2022-08-03 ENCOUNTER — Emergency Department (HOSPITAL_COMMUNITY)
Admission: EM | Admit: 2022-08-03 | Discharge: 2022-08-04 | Disposition: A | Discharge status: Home / Self Care | Payer: 59 | Attending: Emergency Medicine | Admitting: Emergency Medicine

## 2022-08-03 DIAGNOSIS — S93402A Sprain of unspecified ligament of left ankle, initial encounter: Secondary | ICD-10-CM | POA: Insufficient documentation

## 2022-08-03 DIAGNOSIS — W52XXXA Crushed, pushed or stepped on by crowd or human stampede, initial encounter: Secondary | ICD-10-CM | POA: Diagnosis not present

## 2022-08-03 DIAGNOSIS — Z043 Encounter for examination and observation following other accident: Secondary | ICD-10-CM | POA: Diagnosis not present

## 2022-08-03 DIAGNOSIS — S99922A Unspecified injury of left foot, initial encounter: Secondary | ICD-10-CM | POA: Diagnosis not present

## 2022-08-03 DIAGNOSIS — S99912A Unspecified injury of left ankle, initial encounter: Secondary | ICD-10-CM | POA: Diagnosis not present

## 2022-08-03 MED ORDER — NAPROXEN 500 MG PO TABS: 500.0000 mg | ORAL_TABLET | Freq: Two times a day (BID) | ORAL | 0 refills | Status: DC
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
## 2022-08-03 NOTE — Discharge Instructions (Addendum)
It was a pleasure taking care of you here in the emergency department  You most likely have a sprain or a strain of your ankle and foot.  We have given you a brace and crutches.  Try to stay off of this the next few days.  I written you for some anti-inflammatories to help with the pain.  Return for new or worsening symptoms otherwise may follow-up with orthopedics for reevaluation.  If you do not have 1 there is one listed in your discharge paperwork.  You will need to call to schedule appointment.

## 2022-08-03 NOTE — ED Triage Notes (Signed)
Pt to ED c/o left foot injury that occurred last night. Reports unable to put weight on foot.

## 2022-08-14 ENCOUNTER — Observation Stay (HOSPITAL_COMMUNITY)
Admission: EM | Admit: 2022-08-14 | Discharge: 2022-08-15 | Disposition: A | Discharge status: Home / Self Care | Payer: 59 | Attending: Orthopedic Surgery | Admitting: Orthopedic Surgery

## 2022-08-14 ENCOUNTER — Other Ambulatory Visit: Payer: Self-pay

## 2022-08-14 ENCOUNTER — Emergency Department (HOSPITAL_COMMUNITY): Payer: 59

## 2022-08-14 ENCOUNTER — Encounter (HOSPITAL_COMMUNITY): Payer: Self-pay

## 2022-08-14 DIAGNOSIS — Z9889 Other specified postprocedural states: Secondary | ICD-10-CM

## 2022-08-14 DIAGNOSIS — Z23 Encounter for immunization: Secondary | ICD-10-CM | POA: Diagnosis not present

## 2022-08-14 DIAGNOSIS — E119 Type 2 diabetes mellitus without complications: Secondary | ICD-10-CM | POA: Insufficient documentation

## 2022-08-14 DIAGNOSIS — F10929 Alcohol use, unspecified with intoxication, unspecified: Secondary | ICD-10-CM | POA: Diagnosis not present

## 2022-08-14 DIAGNOSIS — W3400XA Accidental discharge from unspecified firearms or gun, initial encounter: Secondary | ICD-10-CM | POA: Diagnosis not present

## 2022-08-14 DIAGNOSIS — S81802A Unspecified open wound, left lower leg, initial encounter: Secondary | ICD-10-CM | POA: Diagnosis not present

## 2022-08-14 DIAGNOSIS — S82202B Unspecified fracture of shaft of left tibia, initial encounter for open fracture type I or II: Principal | ICD-10-CM | POA: Insufficient documentation

## 2022-08-14 DIAGNOSIS — S81832A Puncture wound without foreign body, left lower leg, initial encounter: Secondary | ICD-10-CM

## 2022-08-14 DIAGNOSIS — Z86718 Personal history of other venous thrombosis and embolism: Secondary | ICD-10-CM | POA: Insufficient documentation

## 2022-08-14 DIAGNOSIS — S82252B Displaced comminuted fracture of shaft of left tibia, initial encounter for open fracture type I or II: Secondary | ICD-10-CM | POA: Diagnosis not present

## 2022-08-14 DIAGNOSIS — Z79899 Other long term (current) drug therapy: Secondary | ICD-10-CM | POA: Diagnosis not present

## 2022-08-14 DIAGNOSIS — M898X8 Other specified disorders of bone, other site: Secondary | ICD-10-CM | POA: Diagnosis not present

## 2022-08-14 MED ORDER — TETANUS-DIPHTH-ACELL PERTUSSIS 5-2.5-18.5 LF-MCG/0.5 IM SUSY
0.5000 mL | PREFILLED_SYRINGE | Freq: Once | INTRAMUSCULAR | Status: AC
  Administered 2022-08-15: 0.5 mL via INTRAMUSCULAR
  Filled 2022-08-14: qty 0.5

## 2022-08-14 MED ORDER — FENTANYL CITRATE PF 50 MCG/ML IJ SOSY
50.0000 ug | PREFILLED_SYRINGE | Freq: Once | INTRAMUSCULAR | Status: AC
  Administered 2022-08-15: 50 ug via INTRAVENOUS
  Filled 2022-08-14: qty 1
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
## 2022-08-15 ENCOUNTER — Encounter (HOSPITAL_COMMUNITY)
Admission: EM | Disposition: A | Discharge status: Home / Self Care | Payer: Self-pay | Source: Home / Self Care | Attending: Orthopedic Surgery

## 2022-08-15 ENCOUNTER — Emergency Department (HOSPITAL_BASED_OUTPATIENT_CLINIC_OR_DEPARTMENT_OTHER): Payer: 59 | Admitting: Certified Registered Nurse Anesthetist

## 2022-08-15 ENCOUNTER — Other Ambulatory Visit: Payer: Self-pay

## 2022-08-15 ENCOUNTER — Emergency Department (HOSPITAL_COMMUNITY): Payer: 59 | Admitting: Certified Registered Nurse Anesthetist

## 2022-08-15 ENCOUNTER — Inpatient Hospital Stay (HOSPITAL_COMMUNITY): Payer: 59

## 2022-08-15 ENCOUNTER — Emergency Department (HOSPITAL_COMMUNITY): Payer: 59

## 2022-08-15 ENCOUNTER — Encounter (HOSPITAL_COMMUNITY): Payer: Self-pay | Admitting: Certified Registered Nurse Anesthetist

## 2022-08-15 DIAGNOSIS — S82202B Unspecified fracture of shaft of left tibia, initial encounter for open fracture type I or II: Secondary | ICD-10-CM | POA: Diagnosis not present

## 2022-08-15 DIAGNOSIS — M898X8 Other specified disorders of bone, other site: Secondary | ICD-10-CM | POA: Diagnosis not present

## 2022-08-15 DIAGNOSIS — S82202A Unspecified fracture of shaft of left tibia, initial encounter for closed fracture: Secondary | ICD-10-CM | POA: Diagnosis not present

## 2022-08-15 DIAGNOSIS — Z9889 Other specified postprocedural states: Secondary | ICD-10-CM

## 2022-08-15 DIAGNOSIS — S81832A Puncture wound without foreign body, left lower leg, initial encounter: Secondary | ICD-10-CM | POA: Diagnosis not present

## 2022-08-15 DIAGNOSIS — W3400XA Accidental discharge from unspecified firearms or gun, initial encounter: Secondary | ICD-10-CM | POA: Diagnosis not present

## 2022-08-15 DIAGNOSIS — S82252B Displaced comminuted fracture of shaft of left tibia, initial encounter for open fracture type I or II: Secondary | ICD-10-CM | POA: Diagnosis not present

## 2022-08-15 DIAGNOSIS — S82201B Unspecified fracture of shaft of right tibia, initial encounter for open fracture type I or II: Secondary | ICD-10-CM | POA: Diagnosis not present

## 2022-08-15 HISTORY — PX: I & D EXTREMITY: SHX5045

## 2022-08-15 HISTORY — PX: TIBIA IM NAIL INSERTION: SHX2516

## 2022-08-15 LAB — CBC
HCT: 34.6 % — ABNORMAL LOW (ref 39.0–52.0)
HCT: 44 % (ref 39.0–52.0)
Hemoglobin: 11 g/dL — ABNORMAL LOW (ref 13.0–17.0)
Hemoglobin: 13.7 g/dL (ref 13.0–17.0)
MCH: 24.4 pg — ABNORMAL LOW (ref 26.0–34.0)
MCH: 25.3 pg — ABNORMAL LOW (ref 26.0–34.0)
MCHC: 31.1 g/dL (ref 30.0–36.0)
MCHC: 31.8 g/dL (ref 30.0–36.0)
MCV: 78.4 fL — ABNORMAL LOW (ref 80.0–100.0)
MCV: 79.5 fL — ABNORMAL LOW (ref 80.0–100.0)
Platelets: 172 10*3/uL (ref 150–400)
Platelets: 200 10*3/uL (ref 150–400)
RBC: 4.35 MIL/uL (ref 4.22–5.81)
RBC: 5.61 MIL/uL (ref 4.22–5.81)
RDW: 14.5 % (ref 11.5–15.5)
RDW: 14.5 % (ref 11.5–15.5)
WBC: 3.4 10*3/uL — ABNORMAL LOW (ref 4.0–10.5)
WBC: 6.3 10*3/uL (ref 4.0–10.5)
nRBC: 0 % (ref 0.0–0.2)
nRBC: 0 % (ref 0.0–0.2)

## 2022-08-15 LAB — BASIC METABOLIC PANEL
Anion gap: 9 (ref 5–15)
BUN: <5 mg/dL — ABNORMAL LOW (ref 6–20)
CO2: 25 mmol/L (ref 22–32)
Calcium: 7.7 mg/dL — ABNORMAL LOW (ref 8.9–10.3)
Chloride: 107 mmol/L (ref 98–111)
Creatinine, Ser: 1.22 mg/dL (ref 0.61–1.24)
GFR, Estimated: >60 mL/min (ref >60)
Glucose, Bld: 95 mg/dL (ref 70–99)
Potassium: 3.4 mmol/L — ABNORMAL LOW (ref 3.5–5.1)
Sodium: 141 mmol/L (ref 135–145)

## 2022-08-15 LAB — PROTIME-INR
INR: 1.1 (ref 0.8–1.2)
Prothrombin Time: 14.1 seconds (ref 11.4–15.2)

## 2022-08-15 LAB — CREATININE, SERUM
Creatinine, Ser: 1.12 mg/dL (ref 0.61–1.24)
GFR, Estimated: >60 mL/min (ref >60)

## 2022-08-15 SURGERY — IRRIGATION AND DEBRIDEMENT EXTREMITY
Anesthesia: General | Laterality: Left

## 2022-08-15 MED ORDER — ROCURONIUM BROMIDE 10 MG/ML (PF) SYRINGE
PREFILLED_SYRINGE | INTRAVENOUS | Status: AC
  Filled 2022-08-15: qty 10

## 2022-08-15 MED ORDER — PROMETHAZINE HCL 25 MG/ML IJ SOLN
6.2500 mg | INTRAMUSCULAR | Status: DC | PRN
  Administered 2022-08-15: 12.5 mg via INTRAVENOUS

## 2022-08-15 MED ORDER — OXYCODONE HCL 5 MG/5ML PO SOLN: 5.0000 mg | Freq: Once | ORAL | Status: DC | PRN

## 2022-08-15 MED ORDER — LEVETIRACETAM 500 MG PO TABS
500.0000 mg | ORAL_TABLET | Freq: Two times a day (BID) | ORAL | Status: DC
  Administered 2022-08-15: 500 mg via ORAL
  Filled 2022-08-15: qty 1

## 2022-08-15 MED ORDER — MIDAZOLAM HCL 2 MG/2ML IJ SOLN
INTRAMUSCULAR | Status: AC
  Filled 2022-08-15: qty 2

## 2022-08-15 MED ORDER — MIDAZOLAM HCL 2 MG/2ML IJ SOLN
INTRAMUSCULAR | Status: DC | PRN
  Administered 2022-08-15: 2 mg via INTRAVENOUS

## 2022-08-15 MED ORDER — DEXAMETHASONE SODIUM PHOSPHATE 10 MG/ML IJ SOLN
INTRAMUSCULAR | Status: AC
  Filled 2022-08-15: qty 1

## 2022-08-15 MED ORDER — MEPERIDINE HCL 25 MG/ML IJ SOLN: 6.2500 mg | INTRAMUSCULAR | Status: DC | PRN

## 2022-08-15 MED ORDER — OXYCODONE HCL 5 MG PO TABS: 5.0000 mg | ORAL_TABLET | Freq: Once | ORAL | Status: DC | PRN

## 2022-08-15 MED ORDER — LIDOCAINE 2% (20 MG/ML) 5 ML SYRINGE
INTRAMUSCULAR | Status: DC | PRN
  Administered 2022-08-15: 40 mg via INTRAVENOUS

## 2022-08-15 MED ORDER — PROPOFOL 10 MG/ML IV BOLUS
INTRAVENOUS | Status: DC | PRN
  Administered 2022-08-15: 150 mg via INTRAVENOUS

## 2022-08-15 MED ORDER — CEFAZOLIN SODIUM-DEXTROSE 2-4 GM/100ML-% IV SOLN
2.0000 g | Freq: Four times a day (QID) | INTRAVENOUS | Status: DC
  Administered 2022-08-15 (×2): 2 g via INTRAVENOUS
  Filled 2022-08-15 (×2): qty 100

## 2022-08-15 MED ORDER — DOCUSATE SODIUM 100 MG PO CAPS
100.0000 mg | ORAL_CAPSULE | Freq: Two times a day (BID) | ORAL | Status: DC
  Administered 2022-08-15: 100 mg via ORAL
  Filled 2022-08-15: qty 1

## 2022-08-15 MED ORDER — ACETAMINOPHEN 325 MG PO TABS: 325.0000 mg | ORAL_TABLET | Freq: Four times a day (QID) | ORAL | Status: DC | PRN

## 2022-08-15 MED ORDER — ONDANSETRON HCL 4 MG PO TABS: 4.0000 mg | ORAL_TABLET | Freq: Four times a day (QID) | ORAL | Status: DC | PRN

## 2022-08-15 MED ORDER — ONDANSETRON HCL 4 MG/2ML IJ SOLN
INTRAMUSCULAR | Status: AC
  Filled 2022-08-15: qty 2

## 2022-08-15 MED ORDER — ACETAMINOPHEN 500 MG PO TABS
500.0000 mg | ORAL_TABLET | Freq: Four times a day (QID) | ORAL | Status: DC
  Administered 2022-08-15: 500 mg via ORAL
  Filled 2022-08-15 (×2): qty 1

## 2022-08-15 MED ORDER — METOCLOPRAMIDE HCL 5 MG/ML IJ SOLN: 5.0000 mg | Freq: Three times a day (TID) | INTRAMUSCULAR | Status: DC | PRN

## 2022-08-15 MED ORDER — METHOCARBAMOL 750 MG PO TABS: 750.0000 mg | ORAL_TABLET | Freq: Four times a day (QID) | ORAL | 0 refills | Status: DC | PRN

## 2022-08-15 MED ORDER — HYDROMORPHONE HCL 1 MG/ML IJ SOLN
0.2500 mg | INTRAMUSCULAR | Status: DC | PRN
  Administered 2022-08-15: 0.5 mg via INTRAVENOUS

## 2022-08-15 MED ORDER — OXYCODONE HCL 5 MG PO TABS: 5.0000 mg | ORAL_TABLET | ORAL | 0 refills | Status: DC | PRN

## 2022-08-15 MED ORDER — CEFAZOLIN SODIUM-DEXTROSE 2-4 GM/100ML-% IV SOLN
2.0000 g | Freq: Once | INTRAVENOUS | Status: AC
  Administered 2022-08-15: 2 g via INTRAVENOUS
  Filled 2022-08-15: qty 100

## 2022-08-15 MED ORDER — HYDROMORPHONE HCL 1 MG/ML IJ SOLN
INTRAMUSCULAR | Status: AC
  Filled 2022-08-15: qty 1

## 2022-08-15 MED ORDER — FENTANYL CITRATE (PF) 250 MCG/5ML IJ SOLN
INTRAMUSCULAR | Status: AC
  Filled 2022-08-15: qty 5

## 2022-08-15 MED ORDER — HYDROCODONE-ACETAMINOPHEN 5-325 MG PO TABS: 1.0000 | ORAL_TABLET | ORAL | Status: DC | PRN

## 2022-08-15 MED ORDER — ACETAMINOPHEN 10 MG/ML IV SOLN
INTRAVENOUS | Status: DC | PRN
  Administered 2022-08-15: 1000 mg via INTRAVENOUS

## 2022-08-15 MED ORDER — DEXAMETHASONE SODIUM PHOSPHATE 10 MG/ML IJ SOLN
INTRAMUSCULAR | Status: DC | PRN
  Administered 2022-08-15: 5 mg via INTRAVENOUS

## 2022-08-15 MED ORDER — LABETALOL HCL 5 MG/ML IV SOLN
INTRAVENOUS | Status: DC | PRN
  Administered 2022-08-15 (×2): 5 mg via INTRAVENOUS

## 2022-08-15 MED ORDER — LACTATED RINGERS IV SOLN: INTRAVENOUS | Status: DC | PRN

## 2022-08-15 MED ORDER — MIDAZOLAM HCL 2 MG/2ML IJ SOLN: 0.5000 mg | Freq: Once | INTRAMUSCULAR | Status: DC | PRN

## 2022-08-15 MED ORDER — PROMETHAZINE HCL 25 MG/ML IJ SOLN
INTRAMUSCULAR | Status: AC
  Filled 2022-08-15: qty 1

## 2022-08-15 MED ORDER — SUGAMMADEX SODIUM 200 MG/2ML IV SOLN
INTRAVENOUS | Status: DC | PRN
  Administered 2022-08-15: 200 mg via INTRAVENOUS

## 2022-08-15 MED ORDER — PROMETHAZINE HCL 25 MG/ML IJ SOLN: 6.2500 mg | INTRAMUSCULAR | Status: DC | PRN

## 2022-08-15 MED ORDER — PROPOFOL 10 MG/ML IV BOLUS
INTRAVENOUS | Status: AC
  Filled 2022-08-15: qty 20

## 2022-08-15 MED ORDER — METOCLOPRAMIDE HCL 5 MG PO TABS: 5.0000 mg | ORAL_TABLET | Freq: Three times a day (TID) | ORAL | Status: DC | PRN

## 2022-08-15 MED ORDER — ONDANSETRON HCL 4 MG/2ML IJ SOLN
INTRAMUSCULAR | Status: DC | PRN
  Administered 2022-08-15: 4 mg via INTRAVENOUS

## 2022-08-15 MED ORDER — ONDANSETRON HCL 4 MG PO TABS: 4.0000 mg | ORAL_TABLET | Freq: Three times a day (TID) | ORAL | 0 refills | Status: DC | PRN

## 2022-08-15 MED ORDER — LIDOCAINE 2% (20 MG/ML) 5 ML SYRINGE
INTRAMUSCULAR | Status: AC
  Filled 2022-08-15: qty 5

## 2022-08-15 MED ORDER — HYDROCODONE-ACETAMINOPHEN 7.5-325 MG PO TABS
1.0000 | ORAL_TABLET | ORAL | Status: DC | PRN
  Administered 2022-08-15: 2 via ORAL
  Filled 2022-08-15: qty 2

## 2022-08-15 MED ORDER — FENTANYL CITRATE (PF) 250 MCG/5ML IJ SOLN
INTRAMUSCULAR | Status: DC | PRN
  Administered 2022-08-15 (×4): 50 ug via INTRAVENOUS

## 2022-08-15 MED ORDER — ONDANSETRON HCL 4 MG/2ML IJ SOLN: 4.0000 mg | Freq: Four times a day (QID) | INTRAMUSCULAR | Status: DC | PRN

## 2022-08-15 MED ORDER — SUCCINYLCHOLINE CHLORIDE 200 MG/10ML IV SOSY
PREFILLED_SYRINGE | INTRAVENOUS | Status: DC | PRN
  Administered 2022-08-15: 160 mg via INTRAVENOUS

## 2022-08-15 MED ORDER — ENOXAPARIN SODIUM 40 MG/0.4ML IJ SOSY: 40.0000 mg | PREFILLED_SYRINGE | Freq: Every day | INTRAMUSCULAR | Status: DC

## 2022-08-15 MED ORDER — HYDROMORPHONE HCL 1 MG/ML IJ SOLN: 0.2500 mg | INTRAMUSCULAR | Status: DC | PRN

## 2022-08-15 MED ORDER — ROCURONIUM BROMIDE 10 MG/ML (PF) SYRINGE
PREFILLED_SYRINGE | INTRAVENOUS | Status: DC | PRN
  Administered 2022-08-15: 50 mg via INTRAVENOUS

## 2022-08-15 MED ORDER — TRANEXAMIC ACID-NACL 1000-0.7 MG/100ML-% IV SOLN
1000.0000 mg | Freq: Once | INTRAVENOUS | Status: AC
  Administered 2022-08-15: 1000 mg via INTRAVENOUS
  Filled 2022-08-15: qty 100

## 2022-08-15 MED ORDER — SUCCINYLCHOLINE CHLORIDE 200 MG/10ML IV SOSY
PREFILLED_SYRINGE | INTRAVENOUS | Status: AC
  Filled 2022-08-15: qty 10

## 2022-08-15 MED ORDER — SODIUM CHLORIDE 0.9 % IR SOLN
Status: DC | PRN
  Administered 2022-08-15: 3000 mL

## 2022-08-15 MED ORDER — MORPHINE SULFATE (PF) 2 MG/ML IV SOLN
0.5000 mg | INTRAVENOUS | Status: DC | PRN
  Filled 2022-08-15: qty 1

## 2022-08-15 SURGICAL SUPPLY — 104 items
ALCOHOL 70% 16 OZ (MISCELLANEOUS) ×1 IMPLANT
BAG COUNTER SPONGE SURGICOUNT (BAG) ×1 IMPLANT
BAG SPNG CNTER NS LX DISP (BAG) ×1
BANDAGE ESMARK 6X9 LF (GAUZE/BANDAGES/DRESSINGS) ×1 IMPLANT
BIT DRILL 4.0X165 AO STYLE (BIT) IMPLANT
BIT DRILL 4.0X280 (BIT) IMPLANT
BLADE SURG 10 STRL SS (BLADE) ×1 IMPLANT
BNDG CMPR 5X3 KNIT ELC UNQ LF (GAUZE/BANDAGES/DRESSINGS)
BNDG CMPR 5X6 CHSV STRCH STRL (GAUZE/BANDAGES/DRESSINGS) ×2
BNDG CMPR 9X6 STRL LF SNTH (GAUZE/BANDAGES/DRESSINGS) ×1
BNDG CMPR MED 15X6 ELC VLCR LF (GAUZE/BANDAGES/DRESSINGS) ×1
BNDG COHESIVE 1X5 TAN STRL LF (GAUZE/BANDAGES/DRESSINGS) IMPLANT
BNDG COHESIVE 4X5 TAN STRL (GAUZE/BANDAGES/DRESSINGS) ×1 IMPLANT
BNDG COHESIVE 6X5 TAN ST LF (GAUZE/BANDAGES/DRESSINGS) ×2 IMPLANT
BNDG ELASTIC 3INX 5YD STR LF (GAUZE/BANDAGES/DRESSINGS) IMPLANT
BNDG ELASTIC 4X5.8 VLCR STR LF (GAUZE/BANDAGES/DRESSINGS) ×1 IMPLANT
BNDG ELASTIC 6X15 VLCR STRL LF (GAUZE/BANDAGES/DRESSINGS) IMPLANT
BNDG ELASTIC 6X5.8 VLCR STR LF (GAUZE/BANDAGES/DRESSINGS) ×1 IMPLANT
BNDG ESMARK 6X9 LF (GAUZE/BANDAGES/DRESSINGS) ×1
BNDG GAUZE DERMACEA FLUFF 4 (GAUZE/BANDAGES/DRESSINGS) ×3 IMPLANT
BNDG GZE 12X3 1 PLY HI ABS (GAUZE/BANDAGES/DRESSINGS)
BNDG GZE DERMACEA 4 6PLY (GAUZE/BANDAGES/DRESSINGS) ×3
BNDG STRETCH GAUZE 3IN X12FT (GAUZE/BANDAGES/DRESSINGS) IMPLANT
CORD BIPOLAR FORCEPS 12FT (ELECTRODE) IMPLANT
COVER MAYO STAND STRL (DRAPES) ×1 IMPLANT
COVER SURGICAL LIGHT HANDLE (MISCELLANEOUS) ×1 IMPLANT
CUFF TOURN SGL QUICK 24 (TOURNIQUET CUFF)
CUFF TOURN SGL QUICK 34 (TOURNIQUET CUFF)
CUFF TOURN SGL QUICK 42 (TOURNIQUET CUFF) IMPLANT
CUFF TRNQT CYL 24X4X16.5-23 (TOURNIQUET CUFF) IMPLANT
CUFF TRNQT CYL 34X4.125X (TOURNIQUET CUFF) ×2 IMPLANT
DRAPE BILATERAL LIMB T (DRAPES) IMPLANT
DRAPE C-ARM 42X72 X-RAY (DRAPES) ×1 IMPLANT
DRAPE C-ARMOR (DRAPES) ×1 IMPLANT
DRAPE HALF SHEET 40X57 (DRAPES) ×1 IMPLANT
DRAPE IMP U-DRAPE 54X76 (DRAPES) ×1 IMPLANT
DRAPE INCISE IOBAN 66X45 STRL (DRAPES) ×4 IMPLANT
DRAPE POUCH INSTRU U-SHP 10X18 (DRAPES) ×1 IMPLANT
DRAPE SURG 17X23 STRL (DRAPES) IMPLANT
DRAPE U-SHAPE 47X51 STRL (DRAPES) ×1 IMPLANT
DRAPE UTILITY XL STRL (DRAPES) ×2 IMPLANT
DRSG ADAPTIC 3X8 NADH LF (GAUZE/BANDAGES/DRESSINGS) IMPLANT
DURAPREP 26ML APPLICATOR (WOUND CARE) ×1 IMPLANT
ELECT CAUTERY BLADE 6.4 (BLADE) ×1 IMPLANT
ELECT REM PT RETURN 9FT ADLT (ELECTROSURGICAL) ×1
ELECTRODE REM PT RTRN 9FT ADLT (ELECTROSURGICAL) ×1 IMPLANT
FACESHIELD WRAPAROUND (MASK) ×2 IMPLANT
FACESHIELD WRAPAROUND OR TEAM (MASK) ×2 IMPLANT
GAUZE PAD ABD 8X10 STRL (GAUZE/BANDAGES/DRESSINGS) ×1 IMPLANT
GAUZE SPONGE 4X4 12PLY STRL (GAUZE/BANDAGES/DRESSINGS) ×2 IMPLANT
GAUZE XEROFORM 1X8 LF (GAUZE/BANDAGES/DRESSINGS) ×2 IMPLANT
GAUZE XEROFORM 5X9 LF (GAUZE/BANDAGES/DRESSINGS) ×1 IMPLANT
GLOVE BIO SURGEON STRL SZ7.5 (GLOVE) ×2 IMPLANT
GLOVE BIOGEL PI IND STRL 8 (GLOVE) ×2 IMPLANT
GOWN STRL REUS W/ TWL LRG LVL3 (GOWN DISPOSABLE) ×2 IMPLANT
GOWN STRL REUS W/ TWL XL LVL3 (GOWN DISPOSABLE) ×2 IMPLANT
GOWN STRL REUS W/TWL LRG LVL3 (GOWN DISPOSABLE) ×2
GOWN STRL REUS W/TWL XL LVL3 (GOWN DISPOSABLE) ×2
GUIDEWIRE BALL NOSE 3.0X900 (WIRE) ×1
GUIDEWIRE ORTH 900X3XBALL NOSE (WIRE) IMPLANT
HANDPIECE INTERPULSE COAX TIP (DISPOSABLE)
KIT BASIN OR (CUSTOM PROCEDURE TRAY) ×1 IMPLANT
KIT TURNOVER KIT B (KITS) ×1 IMPLANT
MANIFOLD NEPTUNE II (INSTRUMENTS) ×1 IMPLANT
NAIL TIBIAL 9X37.5 (Nail) ×1 IMPLANT
NAIL TIBIAL IM 9X37.5 (Nail) IMPLANT
NS IRRIG 1000ML POUR BTL (IV SOLUTION) ×2 IMPLANT
PACK ORTHO EXTREMITY (CUSTOM PROCEDURE TRAY) ×1 IMPLANT
PACK TOTAL JOINT (CUSTOM PROCEDURE TRAY) ×1 IMPLANT
PAD ARMBOARD 7.5X6 YLW CONV (MISCELLANEOUS) ×2 IMPLANT
PAD CAST 4YDX4 CTTN HI CHSV (CAST SUPPLIES) ×2 IMPLANT
PADDING CAST ABS COTTON 4X4 ST (CAST SUPPLIES) ×2 IMPLANT
PADDING CAST COTTON 4X4 STRL (CAST SUPPLIES) ×2
PADDING CAST COTTON 6X4 STRL (CAST SUPPLIES) ×1 IMPLANT
PIN GUIDE THRD AR 3.2X330 (PIN) IMPLANT
SCREW CORT CAPT LOCK 5X60 (Screw) IMPLANT
SCREW CORT LOCK 5.0X42 (Screw) IMPLANT
SCREW LOCK CORT 5X36 (Screw) IMPLANT
SCREW LOCK CORT 5X48 (Screw) IMPLANT
SET CYSTO W/LG BORE CLAMP LF (SET/KITS/TRAYS/PACK) ×1 IMPLANT
SET HNDPC FAN SPRY TIP SCT (DISPOSABLE) IMPLANT
SHEATH TIB ENTRY SP ART DISP (SHEATH) IMPLANT
SPONGE T-LAP 18X18 ~~LOC~~+RFID (SPONGE) ×2 IMPLANT
STAPLER SKIN PROX WIDE 3.9 (STAPLE) ×1 IMPLANT
STAPLER VISISTAT 35W (STAPLE) IMPLANT
STOCKINETTE IMPERVIOUS 9X36 MD (GAUZE/BANDAGES/DRESSINGS) ×1 IMPLANT
SUT ETHILON 2 0 FS 18 (SUTURE) IMPLANT
SUT ETHILON 2 0 PSLX (SUTURE) IMPLANT
SUT ETHILON 3 0 PS 1 (SUTURE) IMPLANT
SUT MON AB 2-0 CT1 36 (SUTURE) ×1 IMPLANT
SUT VIC AB 0 CT1 27 (SUTURE) ×1
SUT VIC AB 0 CT1 27XBRD ANBCTR (SUTURE) ×1 IMPLANT
SUT VIC AB 2-0 CT1 36 (SUTURE) IMPLANT
SUT VIC AB 2-0 FS1 27 (SUTURE) IMPLANT
SWAB CULTURE ESWAB REG 1ML (MISCELLANEOUS) IMPLANT
SYR CONTROL 10ML LL (SYRINGE) IMPLANT
TIBIAL NAIL 9X37.5 (Nail) ×1 IMPLANT
TOWEL GREEN STERILE (TOWEL DISPOSABLE) ×2 IMPLANT
TOWEL GREEN STERILE FF (TOWEL DISPOSABLE) ×1 IMPLANT
TUBE CONNECTING 12X1/4 (SUCTIONS) ×1 IMPLANT
TUBE NG 5FR 35IN ENFIT (TUBING) IMPLANT
UNDERPAD 30X36 HEAVY ABSORB (UNDERPADS AND DIAPERS) ×2 IMPLANT
WATER STERILE IRR 1000ML POUR (IV SOLUTION) ×1 IMPLANT
YANKAUER SUCT BULB TIP NO VENT (SUCTIONS) ×1 IMPLANT
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
## 2022-08-15 NOTE — Brief Op Note (Signed)
08/15/2022  3:39 AM  PATIENT:  Perry Day  37 y.o. male  PRE-OPERATIVE DIAGNOSIS:  LEFT type II OPEN TIBIA FRACTURE  POST-OPERATIVE DIAGNOSIS:  LEFT type II OPEN TIBIA FRACTURE  PROCEDURE:  Procedure(s): IRRIGATION AND DEBRIDEMENT EXTREMITY (Left) INTRAMEDULLARY (IM) NAIL TIBIAL (Left)  SURGEON:  Surgeons and Role:    * Yolonda Kida, MD - Primary  PHYSICIAN ASSISTANT: none  ASSISTANTS: none   ANESTHESIA:   general  EBL:  100 mL   BLOOD ADMINISTERED:none  DRAINS: none   LOCAL MEDICATIONS USED:  NONE  SPECIMEN:  No Specimen  DISPOSITION OF SPECIMEN:  N/A  COUNTS:  YES  TOURNIQUET:  * No tourniquets in log *  DICTATION: .Note written in EPIC  PLAN OF CARE: Admit to inpatient   PATIENT DISPOSITION:  PACU - hemodynamically stable.   Delay start of Pharmacological VTE agent (>24hrs) due to surgical blood loss or risk of bleeding: not applicable
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
## 2022-08-15 NOTE — Anesthesia Preprocedure Evaluation (Addendum)
Anesthesia Evaluation  Patient identified by MRN, date of birth, ID band Patient awake    Reviewed: Allergy & Precautions, NPO status , Patient's Chart, lab work & pertinent test results  History of Anesthesia Complications Negative for: history of anesthetic complications  Airway Mallampati: I  TM Distance: >3 FB Neck ROM: Full    Dental  (+) Dental Advisory Given, Poor Dentition   Pulmonary neg pulmonary ROS   breath sounds clear to auscultation       Cardiovascular negative cardio ROS  Rhythm:Regular Rate:Normal     Neuro/Psych Seizures - (keppra),     GI/Hepatic negative GI ROS,,,(+)     substance abuse  alcohol use and marijuana use  Endo/Other  negative endocrine ROS    Renal/GU negative Renal ROS     Musculoskeletal   Abdominal   Peds  Hematology negative hematology ROS (+)   Anesthesia Other Findings   Reproductive/Obstetrics                             Anesthesia Physical Anesthesia Plan  ASA: 2 and emergent  Anesthesia Plan: General   Post-op Pain Management: Ofirmev IV (intra-op)* and Dilaudid IV   Induction: Intravenous and Rapid sequence  PONV Risk Score and Plan: 2 and Ondansetron and Dexamethasone  Airway Management Planned: Oral ETT  Additional Equipment: None  Intra-op Plan:   Post-operative Plan: Extubation in OR  Informed Consent: I have reviewed the patients History and Physical, chart, labs and discussed the procedure including the risks, benefits and alternatives for the proposed anesthesia with the patient or authorized representative who has indicated his/her understanding and acceptance.     Dental advisory given  Plan Discussed with: CRNA and Surgeon  Anesthesia Plan Comments:         Anesthesia Quick Evaluation

## 2022-08-15 NOTE — Anesthesia Postprocedure Evaluation (Signed)
Anesthesia Post Note  Patient: Khalil Orejel  Procedure(s) Performed: IRRIGATION AND DEBRIDEMENT EXTREMITY (Left) INTRAMEDULLARY (IM) NAIL TIBIAL (Left)     Patient location during evaluation: PACU Anesthesia Type: General Level of consciousness: sedated and patient cooperative Pain management: pain level controlled Vital Signs Assessment: post-procedure vital signs reviewed and stable Respiratory status: spontaneous breathing, nonlabored ventilation and respiratory function stable Cardiovascular status: blood pressure returned to baseline and stable Postop Assessment: no apparent nausea or vomiting Anesthetic complications: no   No notable events documented.  Last Vitals:  Vitals:   08/15/22 0425 08/15/22 0500  BP: 119/86 104/72  Pulse: 78 79  Resp: 12 14  Temp: 37.3 C 36.5 C  SpO2: 100% 100%    Last Pain:  Vitals:   08/15/22 0500  TempSrc:   PainSc: Asleep                 Jaysean Manville,E. Rocklyn Mayberry

## 2022-08-15 NOTE — Progress Notes (Signed)
Orthopedic Tech Progress Note Patient Details:  Perry Day 1985-10-25 [id number]  Ortho Devices Type of Ortho Device: CAM walker Ortho Device/Splint Location: lle Ortho Device/Splint Interventions: Ordered, Application, Adjustment  Boot sized to patient Post Interventions Patient Tolerated: Well Instructions Provided: Care of device, Adjustment of device  [name] Post 08/15/2022, 6:12 AM

## 2022-08-15 NOTE — Transfer of Care (Signed)
Immediate Anesthesia Transfer of Care Note  Patient: Perry Day  Procedure(s) Performed: IRRIGATION AND DEBRIDEMENT EXTREMITY (Left) INTRAMEDULLARY (IM) NAIL TIBIAL (Left)  Patient Location: PACU  Anesthesia Type:General  Level of Consciousness: drowsy and patient cooperative  Airway & Oxygen Therapy: Patient Spontanous Breathing  Post-op Assessment: Report given to RN and Post -op Vital signs reviewed and stable  Post vital signs: Reviewed and stable  Last Vitals:  Vitals Value Taken Time  BP 115/82 08/15/22 0339  Temp    Pulse    Resp 13 08/15/22 0341  SpO2 100   Vitals shown include unfiled device data.  Last Pain:  Vitals:   08/14/22 2339  TempSrc: Oral  PainSc:       Patients Stated Pain Goal: 0 (08/14/22 2337)  Complications: No notable events documented.

## 2022-08-15 NOTE — Progress Notes (Signed)
   Subjective: Day of Surgery Procedure(s) (LRB): IRRIGATION AND DEBRIDEMENT EXTREMITY (Left) INTRAMEDULLARY (IM) NAIL TIBIAL (Left)  Pt c/o moderate pain to the left lower leg today Denies any new symptoms overnight Currently c/o being hungry  Patient reports pain as moderate.  Objective:   VITALS:   Vitals:   08/15/22 0425 08/15/22 0500  BP: 119/86 104/72  Pulse: 78 79  Resp: 12 14  Temp: 99.2 F (37.3 C) 97.7 F (36.5 C)  SpO2: 100% 100%    Left lower leg: dressing and splint intact Nv intact distally No rashes or edema distally  Limited rom due to guarding and pain  LABS Recent Labs    08/15/22 0001 08/15/22 0647  HGB 13.7 11.0*  HCT 44.0 34.6*  WBC 3.4* 6.3  PLT 200 172    Recent Labs    08/15/22 0001 08/15/22 0647  NA 141  --   K 3.4*  --   BUN <5*  --   CREATININE 1.22 1.12  GLUCOSE 95  --      Assessment/Plan: Day of Surgery Procedure(s) (LRB): IRRIGATION AND DEBRIDEMENT EXTREMITY (Left) INTRAMEDULLARY (IM) NAIL TIBIAL (Left) PT/OT with non weight bearing left lower extremity Pain management Pulmonary toilet Will monitor his progress    Alphonsa Overall PA-C, MPAS [facility] is now [facility] [address], [place], [place] [address] Phone: [phone] [email or web address] Facebook  [employer]

## 2022-08-15 NOTE — Plan of Care (Signed)
  Problem: Pain Managment: Goal: General experience of comfort will improve Outcome: Progressing   Problem: Skin Integrity: Goal: Risk for impaired skin integrity will decrease Outcome: Progressing   Problem: Education: Goal: Knowledge of General Education information will improve Description: Including pain rating scale, medication(s)/side effects and non-pharmacologic comfort measures Outcome: Progressing   

## 2022-08-15 NOTE — Progress Notes (Addendum)
Pt arrived to unit from PACU on 2L [place]. He is asleep however arousable. Vital signs are stable. Ortho tech paged.

## 2022-08-15 NOTE — Progress Notes (Addendum)
Pt wanted to be discharged home this pm. MD notified and order for discharge granted. Discharge education given before leaving.Dressing change supplies also provided. Pt having trouble with his ride home, multiple calls to his mom's phone went unanswered and unreturned. Social worker contacted for taxi voucher assistance. Pt discharged  home via taxi.

## 2022-08-15 NOTE — TOC Transition Note (Signed)
Transition of Care [facility]) - CM/SW Discharge Note   Patient Details  Name: Doral Wenhold MRN: [id number] Date of Birth: 1985-02-04  Transition of Care [facility]) CM/SW Contact:  Ronny Bacon, RN Phone Number: 08/15/2022, 3:28 PM   Clinical Narrative:   Patient being discharged home today. Patient reports unable to use walker due to pain when he puts pressure on leg. Wheelchair ordered through [employer].    Final next level of care: Home/Self Care Barriers to Discharge: No Barriers Identified   Patient Goals and CMS Choice      Discharge Placement                         Discharge Plan and Services Additional resources added to the After Visit Summary for                  DME Arranged: Lightweight manual wheelchair with seat cushion DME Agency: [employer] Date DME Agency Contacted: 08/15/22 Time DME Agency Contacted: 1427 Representative spoke with at DME Agency: Vaughan Basta            Social Determinants of Health (SDOH) Interventions SDOH Screenings   Depression (PHQ2-9): High Risk (04/29/2022)  Tobacco Use: Unknown (08/15/2022)     Readmission Risk Interventions     No data to display

## 2022-08-15 NOTE — Discharge Summary (Signed)
In most cases prophylactic antibiotics for Dental procdeures after total joint surgery are not necessary.  Exceptions are as follows:  1. History of prior total joint infection  2. Severely immunocompromised (Organ Transplant, cancer chemotherapy, Rheumatoid biologic meds such as Humera)  3. Poorly controlled diabetes (A1C &gt; 8.0, blood glucose over 200)  If you have one of these conditions, contact your surgeon for an antibiotic prescription, prior to your dental procedure. Orthopedic Discharge Summary        Physician Discharge Summary  Patient ID: Perry Day MRN: [id number] DOB/AGE: 1985-09-24 37 y.o.  Admit date: 08/14/2022 Discharge date: 08/15/2022   Procedures:  Procedure(s) (LRB): IRRIGATION AND DEBRIDEMENT EXTREMITY (Left) INTRAMEDULLARY (IM) NAIL TIBIAL (Left)  Attending Physician:  Dr. Duwayne Heck   Admission Diagnoses:   left open tibia fracture  Discharge Diagnoses: left open tibia fracture  PCP: Patient, No Pcp Per   Discharged Condition: good  Hospital Course:  Patient underwent the above stated procedure on 08/14/2022. Patient tolerated the procedure well and brought to the recovery room in good condition and subsequently to the floor. Patient had an uncomplicated hospital course and was stable for discharge.   Disposition: Discharge disposition: 01-Home or Self Care      with follow up in 2 weeks    Follow-up Information     Yolonda Kida, MD Follow up in 2 week(s).   Specialty: Orthopedic Surgery Why: For suture removal, For wound re-check Contact information: [address] [place] [address] [phone]                 Dental Antibiotics:  In most cases prophylactic antibiotics for Dental procdeures after total joint surgery are not necessary.  Exceptions are as follows:  1. History of prior total joint infection  2. Severely immunocompromised (Organ Transplant, cancer chemotherapy,  Rheumatoid biologic meds such as Humera)  3. Poorly controlled diabetes (A1C &gt; 8.0, blood glucose over 200)  If you have one of these conditions, contact your surgeon for an antibiotic prescription, prior to your dental procedure.  Discharge Instructions     Call MD / Call 911   Complete by: As directed    If you experience chest pain or shortness of breath, CALL 911 and be transported to the hospital emergency room.  If you develope a fever above 101 F, pus (white drainage) or increased drainage or redness at the wound, or calf pain, call your surgeon's office.   Constipation Prevention   Complete by: As directed    Drink plenty of fluids.  Prune juice may be helpful.  You may use a stool softener, such as Colace (over the counter) 100 mg twice a day.  Use MiraLax (over the counter) for constipation as needed.   Diet - low sodium heart healthy   Complete by: As directed    Increase activity slowly as tolerated   Complete by: As directed    Post-operative opioid taper instructions:   Complete by: As directed    POST-OPERATIVE OPIOID TAPER INSTRUCTIONS: It is important to wean off of your opioid medication as soon as possible. If you do not need pain medication after your surgery it is ok to stop day one. Opioids include: Codeine, Hydrocodone(Norco, Vicodin), Oxycodone(Percocet, oxycontin) and hydromorphone amongst others.  Long term and even short term use of opiods can cause: Increased pain response Dependence Constipation Depression Respiratory depression And more.  Withdrawal symptoms can include Flu like symptoms Nausea, vomiting And more Techniques to manage these  symptoms Hydrate well Eat regular healthy meals Stay active Use relaxation techniques(deep breathing, meditating, yoga) Do Not substitute Alcohol to help with tapering If you have been on opioids for less than two weeks and do not have pain than it is ok to stop all together.  Plan to wean off of  opioids This plan should start within one week post op of your joint replacement. Maintain the same interval or time between taking each dose and first decrease the dose.  Cut the total daily intake of opioids by one tablet each day Next start to increase the time between doses. The last dose that should be eliminated is the evening dose.          Allergies as of 08/15/2022   No Known Allergies      Medication List     STOP taking these medications    acetaminophen 500 MG tablet Commonly known as: TYLENOL   levETIRAcetam 500 MG tablet Commonly known as: Keppra   naproxen 500 MG tablet Commonly known as: NAPROSYN   ondansetron 8 MG disintegrating tablet Commonly known as: ZOFRAN-ODT   potassium chloride SA 20 MEQ tablet Commonly known as: KLOR-CON M       TAKE these medications    methocarbamol 750 MG tablet Commonly known as: Robaxin-750 Take 1 tablet (750 mg total) by mouth every 6 (six) hours as needed for muscle spasms.   ondansetron 4 MG tablet Commonly known as: Zofran Take 1 tablet (4 mg total) by mouth every 8 (eight) hours as needed for nausea or vomiting.   oxyCODONE 5 MG immediate release tablet Commonly known as: Roxicodone Take 1 tablet (5 mg total) by mouth every 4 (four) hours as needed for severe pain or breakthrough pain.          Signed: Thea Gist 08/15/2022, 1:44 PM  [facility] is now [facility] [address], [address], [place], [place] [address] Phone: [phone] Facebook  Instagram  [employer]

## 2022-08-15 NOTE — Discharge Instructions (Signed)
Orthopedic surgery discharge instructions:  -Okay for full weightbearing as tolerated to left lower extremity.  You should utilize crutches if necessary for balance and stability.  -You should wear the boot on your left ankle when you are up and weightbearing for the first 4 weeks postoperatively.  He may remove this boot when you are not weightbearing or when you are sleeping.  -You should elevate your left lower extremity with "toes above nose."  As often as possible throughout the day and night.  This will require multiple pillows underneath the leg.  -Maintain postoperative bandages for 4 days.  He may remove these on the fourth day and replace with daily dry dressings.  He should expect moderate drainage from your incisions which will be noted when you remove the initial bandages from surgery.  -Do not get your incisions wet for the first 1 week after surgery.  Beginning on postoperative day #8 you may begin showering.  Do not submerge underwater.  -Apply ice to the left leg throughout the day for 30 minutes each time.  For mild to moderate pain use Tylenol and Advil around-the-clock.  For breakthrough pain use oxycodone and the muscle relaxer as necessary.  -For the prevention of blood clots she should take an 81 mg aspirin twice per day x 6 weeks.  -You need to follow-up with Dr. Aundria Rud in the office in 2 weeks for standard postoperative check.

## 2022-08-15 NOTE — Anesthesia Procedure Notes (Addendum)
Procedure Name: Intubation Date/Time: 08/15/2022 1:45 AM  Performed by: Lelon Perla, CRNAPre-anesthesia Checklist: Patient identified, Emergency Drugs available, Suction available and Patient being monitored Patient Re-evaluated:Patient Re-evaluated prior to induction Oxygen Delivery Method: Circle system utilized Preoxygenation: Pre-oxygenation with 100% oxygen Induction Type: IV induction, Rapid sequence and Cricoid Pressure applied Laryngoscope Size: Mac and 4 Grade View: Grade I Tube type: Oral Tube size: 7.5 mm Number of attempts: 1 Airway Equipment and Method: Stylet and Oral airway Placement Confirmation: ETT inserted through vocal cords under direct vision, positive ETCO2 and breath sounds checked- equal and bilateral Secured at: 22 cm Tube secured with: Tape Dental Injury: Teeth and Oropharynx as per pre-operative assessment

## 2022-08-16 ENCOUNTER — Encounter (HOSPITAL_COMMUNITY): Payer: Self-pay | Admitting: Orthopedic Surgery

## 2022-09-01 ENCOUNTER — Ambulatory Visit (HOSPITAL_COMMUNITY)
Admission: RE | Admit: 2022-09-01 | Discharge: 2022-09-01 | Disposition: A | Discharge status: Home / Self Care | Payer: 59 | Source: Ambulatory Visit | Attending: Vascular Surgery | Admitting: Vascular Surgery

## 2022-09-01 ENCOUNTER — Other Ambulatory Visit (HOSPITAL_COMMUNITY): Payer: Self-pay | Admitting: Physician Assistant

## 2022-09-01 DIAGNOSIS — S82202D Unspecified fracture of shaft of left tibia, subsequent encounter for closed fracture with routine healing: Secondary | ICD-10-CM | POA: Diagnosis not present

## 2022-09-01 DIAGNOSIS — M79605 Pain in left leg: Secondary | ICD-10-CM | POA: Diagnosis not present

## 2022-09-01 DIAGNOSIS — M79662 Pain in left lower leg: Secondary | ICD-10-CM

## 2022-09-01 DIAGNOSIS — Z4789 Encounter for other orthopedic aftercare: Secondary | ICD-10-CM | POA: Diagnosis not present

## 2022-09-30 DIAGNOSIS — Z4789 Encounter for other orthopedic aftercare: Secondary | ICD-10-CM | POA: Diagnosis not present

## 2022-09-30 DIAGNOSIS — M79662 Pain in left lower leg: Secondary | ICD-10-CM | POA: Diagnosis not present

## 2022-09-30 DIAGNOSIS — S82202D Unspecified fracture of shaft of left tibia, subsequent encounter for closed fracture with routine healing: Secondary | ICD-10-CM | POA: Diagnosis not present

## 2022-09-30 DIAGNOSIS — M79605 Pain in left leg: Secondary | ICD-10-CM | POA: Diagnosis not present

## 2022-11-11 DIAGNOSIS — Z4789 Encounter for other orthopedic aftercare: Secondary | ICD-10-CM | POA: Diagnosis not present

## 2022-11-11 DIAGNOSIS — S82202D Unspecified fracture of shaft of left tibia, subsequent encounter for closed fracture with routine healing: Secondary | ICD-10-CM | POA: Diagnosis not present

## 2023-01-29 ENCOUNTER — Encounter (HOSPITAL_COMMUNITY): Payer: Self-pay | Admitting: Emergency Medicine

## 2023-01-29 ENCOUNTER — Other Ambulatory Visit: Payer: Self-pay

## 2023-01-29 ENCOUNTER — Emergency Department (HOSPITAL_COMMUNITY)
Admission: EM | Admit: 2023-01-29 | Discharge: 2023-01-30 | Disposition: A | Discharge status: Home / Self Care | Payer: 59 | Attending: Emergency Medicine | Admitting: Emergency Medicine

## 2023-01-29 DIAGNOSIS — Z743 Need for continuous supervision: Secondary | ICD-10-CM | POA: Diagnosis not present

## 2023-01-29 DIAGNOSIS — R569 Unspecified convulsions: Secondary | ICD-10-CM | POA: Diagnosis not present

## 2023-01-29 DIAGNOSIS — D649 Anemia, unspecified: Secondary | ICD-10-CM | POA: Diagnosis not present

## 2023-01-29 DIAGNOSIS — K029 Dental caries, unspecified: Secondary | ICD-10-CM | POA: Diagnosis not present

## 2023-01-29 DIAGNOSIS — X58XXXA Exposure to other specified factors, initial encounter: Secondary | ICD-10-CM | POA: Insufficient documentation

## 2023-01-29 DIAGNOSIS — S01512A Laceration without foreign body of oral cavity, initial encounter: Secondary | ICD-10-CM | POA: Insufficient documentation

## 2023-01-29 DIAGNOSIS — R9431 Abnormal electrocardiogram [ECG] [EKG]: Secondary | ICD-10-CM | POA: Diagnosis not present

## 2023-01-29 LAB — BASIC METABOLIC PANEL
Anion gap: 24 — ABNORMAL HIGH (ref 5–15)
BUN: 10 mg/dL (ref 6–20)
CO2: 9 mmol/L — ABNORMAL LOW (ref 22–32)
Calcium: 8 mg/dL — ABNORMAL LOW (ref 8.9–10.3)
Chloride: 101 mmol/L (ref 98–111)
Creatinine, Ser: 1.55 mg/dL — ABNORMAL HIGH (ref 0.61–1.24)
GFR, Estimated: 59 mL/min — ABNORMAL LOW (ref >60)
Glucose, Bld: 119 mg/dL — ABNORMAL HIGH (ref 70–99)
Potassium: 3.9 mmol/L (ref 3.5–5.1)
Sodium: 134 mmol/L — ABNORMAL LOW (ref 135–145)

## 2023-01-29 LAB — CBC
HCT: 41.5 % (ref 39.0–52.0)
Hemoglobin: 12.4 g/dL — ABNORMAL LOW (ref 13.0–17.0)
MCH: 22.8 pg — ABNORMAL LOW (ref 26.0–34.0)
MCHC: 29.9 g/dL — ABNORMAL LOW (ref 30.0–36.0)
MCV: 76.1 fL — ABNORMAL LOW (ref 80.0–100.0)
Platelets: 200 10*3/uL (ref 150–400)
RBC: 5.45 MIL/uL (ref 4.22–5.81)
RDW: 18.3 % — ABNORMAL HIGH (ref 11.5–15.5)
WBC: 8.4 10*3/uL (ref 4.0–10.5)
nRBC: 0 % (ref 0.0–0.2)

## 2023-01-29 LAB — CBG MONITORING, ED: Glucose-Capillary: 129 mg/dL — ABNORMAL HIGH (ref 70–99)

## 2023-01-29 LAB — ETHANOL: Alcohol, Ethyl (B): <10 mg/dL (ref <10)

## 2023-01-29 MED ORDER — LEVETIRACETAM IN NACL 1000 MG/100ML IV SOLN
1000.0000 mg | Freq: Once | INTRAVENOUS | Status: AC
  Administered 2023-01-29: 1000 mg via INTRAVENOUS
  Filled 2023-01-29: qty 100

## 2023-01-29 NOTE — ED Triage Notes (Signed)
BIB EMS from home. Was hanging out with a friend and had reported grand mal seizure lasting 10 min.  EMS did go out earlier today for another seizure but pt was not post ictal at that time and did refuse transport.  Pt is alert at this time and answering questions appropriately. States has been out of his keppra for a few days.  Friends reported occasional seizures not unusual for him but 2 in one day was.

## 2023-01-29 NOTE — ED Provider Notes (Incomplete)
  [place] EMERGENCY DEPARTMENT AT [facility] Provider Note   CSN: [id number] Arrival date & time: 01/29/23  2239     History {Add pertinent medical, surgical, social history, OB history to HPI:1} Chief Complaint  Patient presents with  . Seizures    Arles Larsson is a 37 y.o. male.  HPI     Home Medications Prior to Admission medications   Medication Sig Start Date End Date Taking? Authorizing Provider  methocarbamol (ROBAXIN-750) 750 MG tablet Take 1 tablet (750 mg total) by mouth every 6 (six) hours as needed for muscle spasms. 08/15/22   Yolonda Kida, MD  ondansetron (ZOFRAN) 4 MG tablet Take 1 tablet (4 mg total) by mouth every 8 (eight) hours as needed for nausea or vomiting. 08/15/22   Yolonda Kida, MD  oxyCODONE (ROXICODONE) 5 MG immediate release tablet Take 1 tablet (5 mg total) by mouth every 4 (four) hours as needed for severe pain or breakthrough pain. 08/15/22 08/15/23  Yolonda Kida, MD  APIXABAN Everlene Balls) VTE STARTER PACK (10MG  AND 5MG ) Take as directed on package: start with two-5mg  tablets twice daily for 7 days. On day 8, switch to one-5mg  tablet twice daily. 04/26/20 04/26/20  Gailen Shelter, PA      Allergies    Patient has no known allergies.    Review of Systems   Review of Systems  Physical Exam Updated Vital Signs BP 132/89 (BP Location: Left Arm)   Pulse 96   Temp 98.4 F (36.9 C) (Oral)   Resp 15   SpO2 99%  Physical Exam  ED Results / Procedures / Treatments   Labs (all labs ordered are listed, but only abnormal results are displayed) Labs Reviewed  CBC  BASIC METABOLIC PANEL  CBG MONITORING, ED    EKG None  Radiology No results found.  Procedures Procedures  {Document cardiac monitor, telemetry assessment procedure when appropriate:1}  Medications Ordered in ED Medications - No data to display  ED Course/ Medical Decision Making/ A&P   {   Click here for ABCD2, HEART and other  calculatorsREFRESH Note before signing :1}                              Medical Decision Making  ***  {Document critical care time when appropriate:1} {Document review of labs and clinical decision tools ie heart score, Chads2Vasc2 etc:1}  {Document your independent review of radiology images, and any outside records:1} {Document your discussion with family members, caretakers, and with consultants:1} {Document social determinants of health affecting pt's care:1} {Document your decision making why or why not admission, treatments were needed:1} Final Clinical Impression(s) / ED Diagnoses Final diagnoses:  None    Rx / DC Orders ED Discharge Orders     None

## 2023-01-29 NOTE — ED Provider Notes (Signed)
[place] EMERGENCY DEPARTMENT AT [facility] Provider Note   CSN: [id number] Arrival date & time: 01/29/23  2239     History  Chief Complaint  Patient presents with   Seizures    Perry Day is a 37 y.o. 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  Patient states that both episodes were preceded by bad taste in his mouth, which he states occurs every time he has a seizure.  States that he was able to lower himself to the ground before losing consciousness.  Seizures resolved spontaneously, EMS was called initially after first episode today but patient refused transport.  Subsequent episode today patient was postictal and came in by EMS.  States that he has not taken his Keppra in 2 days and was binge drinking yesterday and vomiting all morning today prior to his seizure episodes.  Also used marijuana last night.  Denies any other recreational substance usage.  No anticoagulation currently per patient.   HPI     Home Medications Prior to Admission medications   Medication Sig Start Date End Date Taking? Authorizing Provider  methocarbamol (ROBAXIN-750) 750 MG tablet Take 1 tablet (750 mg total) by mouth every 6 (six) hours as needed for muscle spasms. 08/15/22   Yolonda Kida, MD  ondansetron (ZOFRAN) 4 MG tablet Take 1 tablet (4 mg total) by mouth every 8 (eight) hours as needed for nausea or vomiting. 08/15/22   Yolonda Kida, MD  oxyCODONE (ROXICODONE) 5 MG immediate release tablet Take 1 tablet (5 mg total) by mouth every 4 (four) hours as needed for severe pain or breakthrough pain. 08/15/22 08/15/23  Yolonda Kida, MD  APIXABAN Everlene Balls) VTE STARTER PACK (10MG  AND 5MG ) Take as directed on package: start with two-5mg  tablets twice daily for 7 days. On day 8, switch to one-5mg  tablet twice daily. 04/26/20  04/26/20  Gailen Shelter, PA      Allergies    Patient has no known allergies.    Review of Systems   Review of Systems  Neurological:  Positive for seizures.    Physical Exam Updated Vital Signs BP 132/89 (BP Location: Left Arm)   Pulse 96   Temp 98.4 F (36.9 C) (Oral)   Resp 15   SpO2 99%  Physical Exam Vitals and nursing note reviewed.  Constitutional:      Appearance: He is not ill-appearing or toxic-appearing.  HENT:     Head: Normocephalic and atraumatic.     Mouth/Throat:     Mouth: Mucous membranes are moist. Lacerations present.     Dentition: Dental caries present.     Pharynx: No oropharyngeal exudate or posterior oropharyngeal erythema.   Eyes:     General:        Right eye: No discharge.        Left eye: No discharge.     Extraocular Movements: Extraocular movements intact.     Conjunctiva/sclera: Conjunctivae normal.  Cardiovascular:     Rate and Rhythm: Normal rate and regular rhythm.     Pulses: Normal pulses.     Heart sounds: Normal heart sounds. No murmur heard. Pulmonary:     Effort: Pulmonary effort is normal. No respiratory distress.     Breath sounds: Normal breath sounds. No wheezing or rales.  Abdominal:     General: Bowel sounds are normal. There is no distension.  Palpations: Abdomen is soft.     Tenderness: There is no abdominal tenderness. There is no guarding or rebound.  Musculoskeletal:        General: No deformity.     Cervical back: Neck supple.  Skin:    General: Skin is warm and dry.     Capillary Refill: Capillary refill takes less than 2 seconds.  Neurological:     General: No focal deficit present.     Mental Status: He is alert and oriented to person, place, and time. Mental status is at baseline.     GCS: GCS eye subscore is 4. GCS verbal subscore is 5. GCS motor subscore is 6.     Cranial Nerves: Cranial nerves 2-12 are intact.     Sensory: Sensation is intact.     Motor: Motor function is intact.      Coordination: Coordination is intact.  Psychiatric:        Mood and Affect: Mood normal.     ED Results / Procedures / Treatments   Labs (all labs ordered are listed, but only abnormal results are displayed) Labs Reviewed  CBC  BASIC METABOLIC PANEL  CBG MONITORING, ED    EKG None  Radiology No results found.  Procedures Procedures    Medications Ordered in ED Medications - No data to display  ED Course/ Medical Decision Making/ A&P                                 Medical Decision Making 37 year old male with history of poor compliance with his anticonvulsant medications who presents with 2 breakthrough seizures today after going over 48 hours without his medications and with drinking alcohol.   Normal vitals throughout stay in the ED, cardiopulmonary exam unremarkable, abdominal exam is benign.  Patient neurologically intact without focal deficit.  Does have tiny laceration to the tongue, hemostatic at this time.  No repair warranted.  Amount and/or Complexity of Data Reviewed Labs: ordered.    Details: CBC without leukocytosis, mild anemia with hemoglobin of 12.4.  BMP with AKI with creatinine of 1.5 up from patient's baseline 1.1 to. Anion gap 24. ETOH <10.   Risk Prescription drug management.   Repeat BMP without anion gap elevation, normal electrolytes.  Lactic acid improved to 1 after fluid bolus.  Clinical picture most consistent with breakthrough seizure in context of missing multiple doses of his antiepileptic medication.  Bolused with Keppra in the ED, prescription refill provided for Keppra, will refer to outpatient neurology as well as patient has not established with them yet.  Clinical concern for emergent underlying condition that would warrant further ED workup and patient management is exceedingly low.  Perry Day voiced understanding of his medical evaluation and treatment plan. Each of their questions answered to their expressed satisfaction.  Return  precautions were given.  Patient is well-appearing, stable, and was discharged in good condition.  This chart was dictated using voice recognition software, Dragon. Despite the best efforts of this provider to proofread and correct errors, errors may still occur which can change documentation meaning.         Final Clinical Impression(s) / ED Diagnoses Final diagnoses:  None    Rx / DC Orders ED Discharge Orders     None         Sherrilee Gilles 01/30/23 2595    Charlynne Pander, MD 01/30/23 2238

## 2023-01-30 DIAGNOSIS — R569 Unspecified convulsions: Secondary | ICD-10-CM | POA: Diagnosis not present

## 2023-01-30 LAB — BASIC METABOLIC PANEL
Anion gap: 11 (ref 5–15)
BUN: 9 mg/dL (ref 6–20)
CO2: 20 mmol/L — ABNORMAL LOW (ref 22–32)
Calcium: 7.1 mg/dL — ABNORMAL LOW (ref 8.9–10.3)
Chloride: 106 mmol/L (ref 98–111)
Creatinine, Ser: 1.09 mg/dL (ref 0.61–1.24)
GFR, Estimated: >60 mL/min (ref >60)
Glucose, Bld: 71 mg/dL (ref 70–99)
Potassium: 3.9 mmol/L (ref 3.5–5.1)
Sodium: 137 mmol/L (ref 135–145)

## 2023-01-30 LAB — I-STAT CG4 LACTIC ACID, ED
Lactic Acid, Venous: 2.2 mmol/L (ref 0.5–1.9)
Lactic Acid, Venous: 1 mmol/L (ref 0.5–1.9)

## 2023-01-30 LAB — HEPATIC FUNCTION PANEL
ALT: 135 U/L — ABNORMAL HIGH (ref 0–44)
AST: 204 U/L — ABNORMAL HIGH (ref 15–41)
Albumin: 3.4 g/dL — ABNORMAL LOW (ref 3.5–5.0)
Alkaline Phosphatase: 158 U/L — ABNORMAL HIGH (ref 38–126)
Bilirubin, Direct: 0.3 mg/dL — ABNORMAL HIGH (ref 0.0–0.2)
Indirect Bilirubin: 1.6 mg/dL — ABNORMAL HIGH (ref 0.3–0.9)
Total Bilirubin: 1.9 mg/dL — ABNORMAL HIGH (ref <1.2)
Total Protein: 7.3 g/dL (ref 6.5–8.1)

## 2023-01-30 LAB — MAGNESIUM: Magnesium: 1.8 mg/dL (ref 1.7–2.4)

## 2023-01-30 LAB — CK: Total CK: 772 U/L — ABNORMAL HIGH (ref 49–397)

## 2023-01-30 MED ORDER — SODIUM CHLORIDE 0.9 % IV BOLUS
1000.0000 mL | Freq: Once | INTRAVENOUS | Status: AC
  Administered 2023-01-30: 1000 mL via INTRAVENOUS

## 2023-01-30 MED ORDER — LEVETIRACETAM 500 MG PO TABS: 500.0000 mg | ORAL_TABLET | Freq: Two times a day (BID) | ORAL | 1 refills | Status: DC

## 2023-01-30 NOTE — Discharge Instructions (Signed)
You were seen in the ER today for your seizure.  Please take your seizure medication as prescribed, do not miss doses.  Follow-up with the neurologist listed below.  Additionally follow-up with your primary care doctor or the [facility] community health [facility] wellness clinic for recheck of your liver labs.  Return to the ER with any severe symptoms.

## 2023-02-01 ENCOUNTER — Emergency Department (HOSPITAL_COMMUNITY)
Admission: EM | Admit: 2023-02-01 | Discharge: 2023-02-01 | Discharge status: Against Medical Advice | Payer: 59 | Attending: Emergency Medicine | Admitting: Emergency Medicine

## 2023-02-01 ENCOUNTER — Encounter (HOSPITAL_COMMUNITY): Payer: Self-pay

## 2023-02-01 ENCOUNTER — Other Ambulatory Visit: Payer: Self-pay

## 2023-02-01 DIAGNOSIS — J029 Acute pharyngitis, unspecified: Secondary | ICD-10-CM | POA: Insufficient documentation

## 2023-02-01 DIAGNOSIS — F1090 Alcohol use, unspecified, uncomplicated: Secondary | ICD-10-CM | POA: Insufficient documentation

## 2023-02-01 DIAGNOSIS — Z5321 Procedure and treatment not carried out due to patient leaving prior to being seen by health care provider: Secondary | ICD-10-CM | POA: Insufficient documentation

## 2023-02-01 DIAGNOSIS — R456 Violent behavior: Secondary | ICD-10-CM | POA: Diagnosis not present

## 2023-02-01 NOTE — ED Notes (Signed)
Pt placed in waiting room. Pt states "I took care of that problem myself"

## 2023-02-01 NOTE — ED Notes (Signed)
Pt increasingly agitated. Pt states  "I pulled that sh** out myself" "you all didn't want to do anything". Pt refuses to stay in triage room. Pt currently sitting in the hall.

## 2023-02-01 NOTE — ED Triage Notes (Signed)
 Pt coming in from home with sore throat. Pt pt reports needing to pull something out of his mouth. Ems reports thrush.  Pt reports etoh use.    Ems vitals  Bp 136/84 Pulse 104  Rr 18  97% on ra

## 2023-03-03 ENCOUNTER — Other Ambulatory Visit: Payer: Self-pay

## 2023-03-03 ENCOUNTER — Emergency Department (HOSPITAL_COMMUNITY)
Admission: EM | Admit: 2023-03-03 | Discharge: 2023-03-03 | Disposition: A | Discharge status: Home / Self Care | Payer: 59 | Attending: Emergency Medicine | Admitting: Emergency Medicine

## 2023-03-03 ENCOUNTER — Encounter (HOSPITAL_COMMUNITY): Payer: Self-pay | Admitting: *Deleted

## 2023-03-03 DIAGNOSIS — R569 Unspecified convulsions: Secondary | ICD-10-CM | POA: Diagnosis not present

## 2023-03-03 DIAGNOSIS — I10 Essential (primary) hypertension: Secondary | ICD-10-CM | POA: Diagnosis not present

## 2023-03-03 DIAGNOSIS — Z0000 Encounter for general adult medical examination without abnormal findings: Secondary | ICD-10-CM | POA: Diagnosis not present

## 2023-03-03 DIAGNOSIS — Z5329 Procedure and treatment not carried out because of patient's decision for other reasons: Secondary | ICD-10-CM | POA: Diagnosis not present

## 2023-03-03 DIAGNOSIS — Z139 Encounter for screening, unspecified: Secondary | ICD-10-CM | POA: Insufficient documentation

## 2023-03-03 DIAGNOSIS — Z743 Need for continuous supervision: Secondary | ICD-10-CM | POA: Diagnosis not present

## 2023-03-03 DIAGNOSIS — R0902 Hypoxemia: Secondary | ICD-10-CM | POA: Diagnosis not present

## 2023-03-03 NOTE — ED Notes (Addendum)
This RN reviewed discharge instructions with pt and reviewed that pt would not allow any medical services to be provided today. Pt was not cooperative and refused to answer back or verbalize understanding and continued to lay in bed. Pt has refused all vital signs , EKG, blood work up, etc. [employer] at bedside to help with escorting pt.

## 2023-03-03 NOTE — ED Notes (Signed)
This NT went to pts hall bed and asked if she could do the EKG. Pt refused to respond or allow this NT to do EKG. RN notified.

## 2023-03-03 NOTE — ED Triage Notes (Signed)
Pt arrives via [facility] with reports he can "smell" that he is going to have a seizure. Hx of seizure. No noted seizure activity en route. Alert/oriented. VSS.

## 2023-03-03 NOTE — ED Triage Notes (Signed)
Pt laying on the ground in triage room, says "the smell came back, I just wanted to lay on the ground"

## 2023-03-03 NOTE — ED Notes (Signed)
This RN explained to patient interventions that needed to be done in timely manner (EKG and blood draw/labs) and importance. However, pt refuses to respond at this time. MD notified.

## 2023-03-03 NOTE — ED Provider Notes (Signed)
[facility] DEPT [facility] Emergency Department Provider Note MRN:  [id number]  Arrival date & time: 03/03/23     Chief Complaint   Seizures   History of Present Illness   Perry Day is a 38 y.o. year-old male presents to the ED with chief complaint of feeling like he is going to have a seizure.  He reportedly could "smell a burning smell," which is commonly associated with his seizures per him.  He states that he has been taking his Keppra.  States his last seizure was 2 weeks ago.  He denies fevers, chills, or recent illness.  No seizures with EMS.  Hx provided by EMS, patient, and triage RN.   Review of Systems  Pertinent positive and negative review of systems noted in HPI.    Physical Exam   Vitals:   03/03/23 0046  BP: (!) 117/92  Pulse: 91  Resp: 19  Temp: 98.6 F (37 C)  SpO2: 99%    CONSTITUTIONAL:  non toxic-appearing, NAD NEURO:  Alert and oriented x 3, CN 3-12 grossly intact EYES:  eyes equal and reactive ENT/NECK:  Supple, no stridor  CARDIO:  normal rate, regular rhythm, appears well-perfused  PULM:  No respiratory distress, CTAB GI/GU:  non-distended,  MSK/SPINE:  No gross deformities, no edema, moves all extremities  SKIN:  no rash, atraumatic   *Additional and/or pertinent findings included in MDM below  Diagnostic and Interventional Summary    EKG Interpretation Date/Time:    Ventricular Rate:    PR Interval:    QRS Duration:    QT Interval:    QTC Calculation:   R Axis:      Text Interpretation:         Labs Reviewed  COMPREHENSIVE METABOLIC PANEL  ETHANOL  RAPID URINE DRUG SCREEN, HOSP PERFORMED  CBC WITH DIFFERENTIAL/PLATELET    No orders to display    Medications - No data to display   Procedures  /  Critical Care Procedures  ED Course and Medical Decision Making  I have reviewed the triage vital signs, the nursing notes, and pertinent available records from the EMR.  Social Determinants Affecting  Complexity of Care: Patient has no clinically significant social determinants affecting this chief complaint..   ED Course:    Medical Decision Making Patient here feeling like he is going to have a seizure.  He has not had a seizure.  He does have history of seizures.  He has been compliant with his Keppra.  Patient refusing labs or workup.  Given his refusal of any evaluation, he will be discharged AGAINST MEDICAL ADVICE.  He has not had any witnessed seizures today.  He does not seem postictal.  Vital signs are stable.  Amount and/or Complexity of Data Reviewed Labs: ordered.         Consultants: No consultations were needed in caring for this patient.   Treatment and Plan: Emergency department workup does not suggest an emergent condition requiring admission or immediate intervention beyond  what has been performed at this time. The patient is safe for discharge and has  been instructed to return immediately for worsening symptoms, change in  symptoms or any other concerns    Final Clinical Impressions(s) / ED Diagnoses     ICD-10-CM   1. Encounter for medical screening examination  Z13.9       ED Discharge Orders     None         Discharge Instructions Discussed with and Provided to Patient:  Discharge Instructions   None      Roxy Horseman, PA-C 03/03/23 1610    Gilda Crease, MD 03/04/23 [phone]
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
## 2023-03-14 ENCOUNTER — Emergency Department (HOSPITAL_COMMUNITY): Payer: 59

## 2023-03-14 ENCOUNTER — Encounter (HOSPITAL_COMMUNITY): Payer: Self-pay

## 2023-03-14 ENCOUNTER — Other Ambulatory Visit: Payer: Self-pay

## 2023-03-14 ENCOUNTER — Emergency Department (HOSPITAL_COMMUNITY)
Admission: EM | Admit: 2023-03-14 | Discharge: 2023-03-15 | Disposition: A | Discharge status: Home / Self Care | Payer: 59 | Attending: Emergency Medicine | Admitting: Emergency Medicine

## 2023-03-14 DIAGNOSIS — R569 Unspecified convulsions: Secondary | ICD-10-CM | POA: Diagnosis present

## 2023-03-14 DIAGNOSIS — R1111 Vomiting without nausea: Secondary | ICD-10-CM | POA: Diagnosis not present

## 2023-03-14 DIAGNOSIS — R410 Disorientation, unspecified: Secondary | ICD-10-CM | POA: Diagnosis not present

## 2023-03-14 DIAGNOSIS — G40909 Epilepsy, unspecified, not intractable, without status epilepticus: Secondary | ICD-10-CM | POA: Diagnosis not present

## 2023-03-14 DIAGNOSIS — R55 Syncope and collapse: Secondary | ICD-10-CM | POA: Diagnosis present

## 2023-03-14 DIAGNOSIS — E875 Hyperkalemia: Secondary | ICD-10-CM | POA: Diagnosis not present

## 2023-03-14 DIAGNOSIS — Z20822 Contact with and (suspected) exposure to covid-19: Secondary | ICD-10-CM | POA: Diagnosis not present

## 2023-03-14 DIAGNOSIS — R431 Parosmia: Secondary | ICD-10-CM | POA: Diagnosis not present

## 2023-03-14 DIAGNOSIS — Z79899 Other long term (current) drug therapy: Secondary | ICD-10-CM | POA: Insufficient documentation

## 2023-03-14 DIAGNOSIS — Z743 Need for continuous supervision: Secondary | ICD-10-CM | POA: Diagnosis not present

## 2023-03-14 DIAGNOSIS — G520 Disorders of olfactory nerve: Secondary | ICD-10-CM | POA: Diagnosis not present

## 2023-03-14 DIAGNOSIS — R4020 Unspecified coma: Secondary | ICD-10-CM | POA: Diagnosis not present

## 2023-03-14 DIAGNOSIS — R439 Unspecified disturbances of smell and taste: Secondary | ICD-10-CM | POA: Diagnosis not present

## 2023-03-14 LAB — RAPID URINE DRUG SCREEN, HOSP PERFORMED
Amphetamines: NOT DETECTED
Barbiturates: NOT DETECTED
Benzodiazepines: NOT DETECTED
Cocaine: NOT DETECTED
Opiates: NOT DETECTED
Tetrahydrocannabinol: POSITIVE — AB

## 2023-03-14 LAB — CBC
HCT: 45.9 % (ref 39.0–52.0)
Hemoglobin: 13.8 g/dL (ref 13.0–17.0)
MCH: 23 pg — ABNORMAL LOW (ref 26.0–34.0)
MCHC: 30.1 g/dL (ref 30.0–36.0)
MCV: 76.6 fL — ABNORMAL LOW (ref 80.0–100.0)
Platelets: 256 10*3/uL (ref 150–400)
RBC: 5.99 MIL/uL — ABNORMAL HIGH (ref 4.22–5.81)
RDW: 17.8 % — ABNORMAL HIGH (ref 11.5–15.5)
WBC: 10.8 10*3/uL — ABNORMAL HIGH (ref 4.0–10.5)
nRBC: 0 % (ref 0.0–0.2)

## 2023-03-14 LAB — BASIC METABOLIC PANEL
Anion gap: 19 — ABNORMAL HIGH (ref 5–15)
BUN: 8 mg/dL (ref 6–20)
CO2: 11 mmol/L — ABNORMAL LOW (ref 22–32)
Calcium: 8.4 mg/dL — ABNORMAL LOW (ref 8.9–10.3)
Chloride: 106 mmol/L (ref 98–111)
Creatinine, Ser: 1.22 mg/dL (ref 0.61–1.24)
GFR, Estimated: >60 mL/min (ref >60)
Glucose, Bld: 120 mg/dL — ABNORMAL HIGH (ref 70–99)
Potassium: 5.4 mmol/L — ABNORMAL HIGH (ref 3.5–5.1)
Sodium: 136 mmol/L (ref 135–145)

## 2023-03-14 LAB — MAGNESIUM: Magnesium: 2 mg/dL (ref 1.7–2.4)

## 2023-03-14 LAB — ETHANOL: Alcohol, Ethyl (B): <10 mg/dL (ref <10)

## 2023-03-14 LAB — RESP PANEL BY RT-PCR (RSV, FLU A&B, COVID)  RVPGX2
Influenza A by PCR: NEGATIVE
Influenza B by PCR: NEGATIVE
Resp Syncytial Virus by PCR: NEGATIVE
SARS Coronavirus 2 by RT PCR: NEGATIVE

## 2023-03-14 MED ORDER — ONDANSETRON HCL 4 MG/2ML IJ SOLN
INTRAMUSCULAR | Status: AC
  Administered 2023-03-14: 4 mg via INTRAVENOUS
  Filled 2023-03-14: qty 2

## 2023-03-14 MED ORDER — LEVETIRACETAM IN NACL 1000 MG/100ML IV SOLN
1000.0000 mg | Freq: Once | INTRAVENOUS | Status: AC
  Administered 2023-03-14: 1000 mg via INTRAVENOUS
  Filled 2023-03-14: qty 100

## 2023-03-14 MED ORDER — ONDANSETRON HCL 4 MG/2ML IJ SOLN: 4.0000 mg | Freq: Once | INTRAMUSCULAR | Status: AC

## 2023-03-14 MED ORDER — SODIUM CHLORIDE 0.9 % IV BOLUS
1000.0000 mL | Freq: Once | INTRAVENOUS | Status: AC
  Administered 2023-03-14: 1000 mL via INTRAVENOUS

## 2023-03-14 MED ORDER — KETOROLAC TROMETHAMINE 30 MG/ML IJ SOLN
30.0000 mg | Freq: Once | INTRAMUSCULAR | Status: AC
  Administered 2023-03-14: 30 mg via INTRAVENOUS
  Filled 2023-03-14: qty 1

## 2023-03-14 NOTE — ED Triage Notes (Addendum)
 Pt to ED via EMS with reports of syncopal/ seizure like episode from plasma center where he was in line to donate plasma. Has a history of seizures. Unsure if pt is complaint with keppra  prescription per EMS. Pt does state his last dose was three days. Reports he did not have an aura, only a nauseated feeling before waking up.

## 2023-03-14 NOTE — ED Provider Notes (Signed)
 [place] [facility] Provider Note   CSN: [id number] Arrival date & time: 03/14/23  1452     History  Chief Complaint  Patient presents with   Loss of Consciousness    Bracy Mcclelland is a 38 y.o. male with history of seizure disorder presenting from the plasma donation center with concern for syncope or seizure-like episode.  Patient cannot recall the details involving this.  He says he does not recall having his typical prodrome which is "having a burning smell".  Before his episode.  He feels nauseated upon waking up.  Patient reports he is not typically compliant with his Keppra , though he does continue to have the medications, his last dose may have been about 3 days ago.  HPI     Home Medications Prior to Admission medications   Medication Sig Start Date End Date Taking? Authorizing Provider  levETIRAcetam  (KEPPRA ) 500 MG tablet Take 1 tablet (500 mg total) by mouth 2 (two) times daily. 01/30/23   Sponseller, Anola Basques R, PA-C  methocarbamol  (ROBAXIN -750) 750 MG tablet Take 1 tablet (750 mg total) by mouth every 6 (six) hours as needed for muscle spasms. 08/15/22   Janeth Medicus, MD  ondansetron  (ZOFRAN ) 4 MG tablet Take 1 tablet (4 mg total) by mouth every 8 (eight) hours as needed for nausea or vomiting. 08/15/22   Janeth Medicus, MD  oxyCODONE  (ROXICODONE ) 5 MG immediate release tablet Take 1 tablet (5 mg total) by mouth every 4 (four) hours as needed for severe pain or breakthrough pain. 08/15/22 08/15/23  Janeth Medicus, MD  APIXABAN  (ELIQUIS ) VTE STARTER PACK (10MG  AND 5MG ) Take as directed on package: start with two-5mg  tablets twice daily for 7 days. On day 8, switch to one-5mg  tablet twice daily. 04/26/20 04/26/20  Coretta Dexter, PA      Allergies    Patient has no known allergies.    Review of Systems   Review of Systems  Physical Exam Updated Vital Signs BP 110/80   Pulse 83   Temp 98 F (36.7 C) (Oral)    Resp 13   Ht 5\' 9"  (1.753 m)   Wt 70.3 kg   SpO2 100%   BMI 22.89 kg/m  Physical Exam Constitutional:      General: He is not in acute distress. HENT:     Head: Normocephalic and atraumatic.  Eyes:     Conjunctiva/sclera: Conjunctivae normal.     Pupils: Pupils are equal, round, and reactive to light.  Cardiovascular:     Rate and Rhythm: Normal rate and regular rhythm.  Pulmonary:     Effort: Pulmonary effort is normal. No respiratory distress.  Abdominal:     General: There is no distension.     Tenderness: There is no abdominal tenderness.  Skin:    General: Skin is warm and dry.  Neurological:     General: No focal deficit present.     Mental Status: He is alert. Mental status is at baseline.  Psychiatric:        Mood and Affect: Mood normal.        Behavior: Behavior normal.     ED Results / Procedures / Treatments   Labs (all labs ordered are listed, but only abnormal results are displayed) Labs Reviewed  BASIC METABOLIC PANEL - Abnormal; Notable for the following components:      Result Value   Potassium 5.4 (*)    CO2 11 (*)    Glucose,  Bld 120 (*)    Calcium 8.4 (*)    Anion gap 19 (*)    All other components within normal limits  RAPID URINE DRUG SCREEN, HOSP PERFORMED - Abnormal; Notable for the following components:   Tetrahydrocannabinol POSITIVE (*)    All other components within normal limits  CBC - Abnormal; Notable for the following components:   WBC 10.8 (*)    RBC 5.99 (*)    MCV 76.6 (*)    MCH 23.0 (*)    RDW 17.8 (*)    All other components within normal limits  RESP PANEL BY RT-PCR (RSV, FLU A&B, COVID)  [id number]  ETHANOL  MAGNESIUM    EKG EKG Interpretation Date/Time:  Monday March 14 2023 15:06:43 EST Ventricular Rate:  87 PR Interval:  139 QRS Duration:  87 QT Interval:  367 QTC Calculation: 442 R Axis:   44  Text Interpretation: Sinus rhythm No significant change since prior 12/24 Confirmed by Racheal Buddle [phone])  on 03/14/2023 3:41:08 PM  Radiology DG Chest Portable 1 View Result Date: 03/14/2023 CLINICAL DATA:  Loss of consciousness. EXAM: PORTABLE CHEST 1 VIEW COMPARISON:  July 20, 2022 FINDINGS: The heart size and mediastinal contours are within normal limits. Both lungs are clear. The visualized skeletal structures are unremarkable. IMPRESSION: No active disease. Electronically Signed   By: Virgle Grime M.D.   On: 03/14/2023 19:51    Procedures Procedures    Medications Ordered in ED Medications  levETIRAcetam  (KEPPRA ) IVPB 1000 mg/100 mL premix (0 mg Intravenous Stopped 03/14/23 1820)  ondansetron  (ZOFRAN ) injection 4 mg (4 mg Intravenous Given 03/14/23 1754)  sodium chloride  0.9 % bolus 1,000 mL (0 mLs Intravenous Stopped 03/14/23 2118)  ketorolac  (TORADOL ) 30 MG/ML injection 30 mg (30 mg Intravenous Given 03/14/23 2316)    ED Course/ Medical Decision Making/ A&P                                 Medical Decision Making Amount and/or Complexity of Data Reviewed Labs: ordered. Radiology: ordered.  Risk Prescription drug management.   This patient presents to the ED with concern for near-syncope episode versus seizure. This involves an extensive number of treatment options, and is a complaint that carries with it a high risk of complications and morbidity.  The differential diagnosis includes seizure-like activity versus arrhythmia versus anemia versus other  Co-morbidities that complicate the patient evaluation: History of seizures at risk of breakthrough seizure  Additional history obtained from EMS  External records from outside source obtained and reviewed including history of right acute subdural hematoma in September 2021; history of recurring ED visits for potential breakthrough seizures,  I ordered and personally interpreted labs.  The pertinent results include: No emergent findings.  Patient has a mild hyperkalemia with hemolyzed labs, and no corresponding hyperkalemic EKG  changes.  COVID and flu test are negative.  UDS is positive for THC marijuana.  Small anion gap likely related to mild dehydration.  I ordered imaging studies including x-ray of the chest I independently visualized and interpreted imaging which showed no emergent findings I agree with the radiologist interpretation  The patient was maintained on a cardiac monitor.  I personally viewed and interpreted the cardiac monitored which showed an underlying rhythm of: Sinus rhythm  Per my interpretation the patient's ECG shows this rhythm no acute ischemic findings  I ordered medication including IV fluids for hydration, IV Toradol  for headache, IV  Zofran  for nausea, IV Keppra  for AED (missed doses)  I have reviewed the patients home medicines and have made adjustments as needed  Test Considered: Doubt acute PE, meningitis  Patient is having no report of head injury or red flag syndromes for intracranial injury to warrant emergent CT scan at this time.  After the interventions noted above, I reevaluated the patient and found that they have: improved  Social Determinants of Health: once again I enforced with the patient the importance of remaining compliant with his Keppra  medication, explaining that missing even a single dose could be a trigger for breakthrough seizures.  Dispostion:  After consideration of the diagnostic results and the patients response to treatment, I feel that the patent would benefit from outpatient follow-up.          Final Clinical Impression(s) / ED Diagnoses Final diagnoses:  Near syncope    Rx / DC Orders ED Discharge Orders     None         Chan Rosasco, Janalyn Me, MD 03/14/23 2351

## 2023-03-14 NOTE — ED Notes (Signed)
 Pt with brief episode of loss of consciousness with this RN and nursing student Kamila at bedside. No seizure like activity observed. Pt oxygen saturation decreased to 70. Placed on 6L [place], pt alert to nurse speaking to him. Oxygen turned off once pt aroused. Sats remain at 94 percent on room air. MD notified.

## 2023-03-14 NOTE — Discharge Instructions (Addendum)
 It is very important that you take your Keppra  seizure medication every day as prescribed.  Missing even a single day can cause a seizure to happen.  We gave you Keppra  in the ER.  We also gave you medicine for your headache.

## 2023-03-15 ENCOUNTER — Emergency Department (HOSPITAL_COMMUNITY)
Admission: EM | Admit: 2023-03-15 | Discharge: 2023-03-15 | Disposition: A | Discharge status: Home / Self Care | Payer: 59 | Source: Home / Self Care | Attending: Emergency Medicine | Admitting: Emergency Medicine

## 2023-03-15 ENCOUNTER — Encounter (HOSPITAL_COMMUNITY): Payer: Self-pay | Admitting: Emergency Medicine

## 2023-03-15 DIAGNOSIS — G520 Disorders of olfactory nerve: Secondary | ICD-10-CM | POA: Diagnosis not present

## 2023-03-15 DIAGNOSIS — R439 Unspecified disturbances of smell and taste: Secondary | ICD-10-CM | POA: Diagnosis not present

## 2023-03-15 DIAGNOSIS — R431 Parosmia: Secondary | ICD-10-CM | POA: Insufficient documentation

## 2023-03-15 DIAGNOSIS — Z743 Need for continuous supervision: Secondary | ICD-10-CM | POA: Diagnosis not present

## 2023-03-15 DIAGNOSIS — R55 Syncope and collapse: Secondary | ICD-10-CM | POA: Diagnosis not present

## 2023-03-15 DIAGNOSIS — R569 Unspecified convulsions: Secondary | ICD-10-CM | POA: Diagnosis not present

## 2023-03-15 LAB — BASIC METABOLIC PANEL
Anion gap: 10 (ref 5–15)
BUN: 10 mg/dL (ref 6–20)
CO2: 22 mmol/L (ref 22–32)
Calcium: 8 mg/dL — ABNORMAL LOW (ref 8.9–10.3)
Chloride: 102 mmol/L (ref 98–111)
Creatinine, Ser: 1.19 mg/dL (ref 0.61–1.24)
GFR, Estimated: >60 mL/min (ref >60)
Glucose, Bld: 95 mg/dL (ref 70–99)
Potassium: 3.9 mmol/L (ref 3.5–5.1)
Sodium: 134 mmol/L — ABNORMAL LOW (ref 135–145)

## 2023-03-15 MED ORDER — LEVETIRACETAM 500 MG PO TABS
500.0000 mg | ORAL_TABLET | Freq: Once | ORAL | Status: AC
  Administered 2023-03-15: 500 mg via ORAL
  Filled 2023-03-15: qty 1

## 2023-03-15 MED ORDER — LEVETIRACETAM 500 MG PO TABS: 500.0000 mg | ORAL_TABLET | Freq: Two times a day (BID) | ORAL | 0 refills | Status: DC

## 2023-03-15 NOTE — ED Provider Notes (Signed)
[facility] Emergency Department Provider Note MRN:  [id number]  Arrival date & time: 03/15/23     Chief Complaint   Seizures   History of Present Illness   Perry Day is a 38 y.o. year-old male presents to the ED with chief complaint of patient returns to the emergency department because he feels like he is going to have a seizure.  He was seen yesterday evening for the same.  He has history of Keppra noncompliance.  He states that the people that have his medication will not give it back to him.  He was giving loading dose of Keppra last night.  States that after he was discharged he started to smell his typical smell before he has a seizure.  States that this concerned him, so he came back to the emergency department.  He denies any recent illnesses.  He denies any symptoms at this time.Marland Kitchen  History provided by patient.   Review of Systems  Pertinent positive and negative review of systems noted in HPI.    Physical Exam   Vitals:   03/15/23 0326 03/15/23 0327  BP:  113/77  Pulse:  72  Resp:  19  Temp: 98.1 F (36.7 C)   SpO2:  99%    CONSTITUTIONAL:  non toxic-appearing, NAD NEURO:  Alert and oriented x 3, CN 3-12 grossly intact EYES:  eyes equal and reactive ENT/NECK:  Supple, no stridor  CARDIO:  normal rate, appears well-perfused  PULM:  No respiratory distress,  GI/GU:  non-distended,  MSK/SPINE:  No gross deformities, no edema, moves all extremities  SKIN:  no rash, atraumatic   *Additional and/or pertinent findings included in MDM below  Diagnostic and Interventional Summary    EKG Interpretation Date/Time:    Ventricular Rate:    PR Interval:    QRS Duration:    QT Interval:    QTC Calculation:   R Axis:      Text Interpretation:         Labs Reviewed  BASIC METABOLIC PANEL - Abnormal; Notable for the following components:      Result Value   Sodium 134 (*)    Calcium 8.0 (*)    All other components within normal  limits    No orders to display    Medications  levETIRAcetam (KEPPRA) tablet 500 mg (500 mg Oral Given 03/15/23 0503)     Procedures  /  Critical Care Procedures  ED Course and Medical Decision Making  I have reviewed the triage vital signs, the nursing notes, and pertinent available records from the EMR.  Social Determinants Affecting Complexity of Care: Patient has no clinically significant social determinants affecting this chief complaint..   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  He did not ever actually have a seizure since being discharged.  He was giving a loading dose of Keppra prior to discharge yesterday evening.  I gave him his morning dose of Keppra and have sent a prescription to his pharmacy.  He had a slightly abnormal BMP yesterday evening, so I repeated this.  It looks better now.  I have observed him for several hours without any seizure.  Will discharge home with PCP follow-up.  Amount and/or Complexity of Data Reviewed Labs: ordered.  Risk Prescription drug management.         Consultants: No consultations were needed in caring for this patient.   Treatment  and Plan: Emergency department workup does not suggest an emergent condition requiring admission or immediate intervention beyond  what has been performed at this time. 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

## 2023-03-15 NOTE — ED Triage Notes (Signed)
Pt been out of keppra for 5 days. States he felt aura of smell and taste before he felt like he was going to have one. He states his eyes shut but then opened right back. Appt with neurology on 12th. Seizures started year ago.

## 2023-03-16 ENCOUNTER — Encounter: Payer: Self-pay | Admitting: Neurology

## 2023-03-16 ENCOUNTER — Ambulatory Visit (INDEPENDENT_AMBULATORY_CARE_PROVIDER_SITE_OTHER): Payer: 59 | Admitting: Neurology

## 2023-03-16 VITALS — BP 126/82 | HR 80 | Ht 69.0 in | Wt 161.0 lb

## 2023-03-16 DIAGNOSIS — G40209 Localization-related (focal) (partial) symptomatic epilepsy and epileptic syndromes with complex partial seizures, not intractable, without status epilepticus: Secondary | ICD-10-CM

## 2023-03-16 MED ORDER — LEVETIRACETAM 500 MG PO TABS: 500.0000 mg | ORAL_TABLET | Freq: Two times a day (BID) | ORAL | 3 refills | Status: DC

## 2023-03-16 NOTE — Patient Instructions (Signed)
Continue with Keppra 500 mg twice daily, refill given  Set up care with PCP  Return in 6 months or sooner if worse
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
## 2023-03-19 ENCOUNTER — Other Ambulatory Visit: Payer: Self-pay

## 2023-03-19 ENCOUNTER — Emergency Department (HOSPITAL_COMMUNITY)
Admission: EM | Admit: 2023-03-19 | Discharge: 2023-03-19 | Disposition: A | Discharge status: Home / Self Care | Payer: 59 | Attending: Emergency Medicine | Admitting: Emergency Medicine

## 2023-03-19 ENCOUNTER — Encounter (HOSPITAL_COMMUNITY): Payer: Self-pay | Admitting: Emergency Medicine

## 2023-03-19 ENCOUNTER — Other Ambulatory Visit (HOSPITAL_COMMUNITY): Payer: Self-pay

## 2023-03-19 DIAGNOSIS — F10929 Alcohol use, unspecified with intoxication, unspecified: Secondary | ICD-10-CM

## 2023-03-19 DIAGNOSIS — F10129 Alcohol abuse with intoxication, unspecified: Secondary | ICD-10-CM | POA: Diagnosis not present

## 2023-03-19 DIAGNOSIS — Z59819 Housing instability, housed unspecified: Secondary | ICD-10-CM | POA: Diagnosis not present

## 2023-03-19 DIAGNOSIS — Y908 Blood alcohol level of 240 mg/100 ml or more: Secondary | ICD-10-CM | POA: Diagnosis not present

## 2023-03-19 DIAGNOSIS — R404 Transient alteration of awareness: Secondary | ICD-10-CM | POA: Diagnosis not present

## 2023-03-19 DIAGNOSIS — R9431 Abnormal electrocardiogram [ECG] [EKG]: Secondary | ICD-10-CM | POA: Diagnosis not present

## 2023-03-19 DIAGNOSIS — R55 Syncope and collapse: Secondary | ICD-10-CM | POA: Diagnosis not present

## 2023-03-19 DIAGNOSIS — R42 Dizziness and giddiness: Secondary | ICD-10-CM | POA: Diagnosis not present

## 2023-03-19 LAB — BASIC METABOLIC PANEL
Anion gap: 10 (ref 5–15)
BUN: <5 mg/dL — ABNORMAL LOW (ref 6–20)
CO2: 24 mmol/L (ref 22–32)
Calcium: 7.8 mg/dL — ABNORMAL LOW (ref 8.9–10.3)
Chloride: 110 mmol/L (ref 98–111)
Creatinine, Ser: 0.92 mg/dL (ref 0.61–1.24)
GFR, Estimated: >60 mL/min (ref >60)
Glucose, Bld: 88 mg/dL (ref 70–99)
Potassium: 3.6 mmol/L (ref 3.5–5.1)
Sodium: 144 mmol/L (ref 135–145)

## 2023-03-19 LAB — CBC
HCT: 38 % — ABNORMAL LOW (ref 39.0–52.0)
Hemoglobin: 11.8 g/dL — ABNORMAL LOW (ref 13.0–17.0)
MCH: 23.4 pg — ABNORMAL LOW (ref 26.0–34.0)
MCHC: 31.1 g/dL (ref 30.0–36.0)
MCV: 75.4 fL — ABNORMAL LOW (ref 80.0–100.0)
Platelets: 218 10*3/uL (ref 150–400)
RBC: 5.04 MIL/uL (ref 4.22–5.81)
RDW: 18.1 % — ABNORMAL HIGH (ref 11.5–15.5)
WBC: 3.3 10*3/uL — ABNORMAL LOW (ref 4.0–10.5)
nRBC: 0 % (ref 0.0–0.2)

## 2023-03-19 LAB — ETHANOL: Alcohol, Ethyl (B): 357 mg/dL (ref <10)

## 2023-03-19 MED ORDER — LEVETIRACETAM 500 MG PO TABS
500.0000 mg | ORAL_TABLET | Freq: Two times a day (BID) | ORAL | Status: DC
Start: 2023-03-19 — End: 2023-03-19
  Filled 2023-03-19: qty 1

## 2023-03-19 MED ORDER — LEVETIRACETAM 500 MG PO TABS
500.0000 mg | ORAL_TABLET | Freq: Two times a day (BID) | ORAL | 3 refills | Status: DC
  Filled 2023-03-19: qty 60, 30d supply, fill #0

## 2023-03-19 NOTE — ED Notes (Signed)
Pt won't answer when asked if he can give [place] a urine sample.  KM
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
## 2023-03-19 NOTE — ED Notes (Signed)
Witnessed patient getting out of wheelchair and laying down on floor.

## 2023-03-19 NOTE — Discharge Instructions (Addendum)
You were seen today due to possibly concerns for developing seizure as well as alcohol intoxication.  Due to you having been observed in the ER for an extended time, I do not believe that any emergent condition is present at this time.  Alcohol seems to have worn off as well.  Recommend that you continue to evaluate how much you are drinking as this will also affect your risk for developing a seizure.  Recommend that you continue to take your Keppra as prescribed.  I have refilled your Keppra today.
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
## 2023-03-19 NOTE — ED Notes (Signed)
 ED Provider at bedside.
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
## 2023-03-21 ENCOUNTER — Other Ambulatory Visit (HOSPITAL_COMMUNITY): Payer: Self-pay

## 2023-04-25 ENCOUNTER — Other Ambulatory Visit: Payer: Self-pay

## 2023-04-25 DIAGNOSIS — R439 Unspecified disturbances of smell and taste: Secondary | ICD-10-CM | POA: Diagnosis not present

## 2023-04-26 ENCOUNTER — Emergency Department (HOSPITAL_COMMUNITY)
Admission: EM | Admit: 2023-04-26 | Discharge: 2023-04-26 | Disposition: A | Discharge status: Home / Self Care | Attending: Emergency Medicine | Admitting: Emergency Medicine

## 2023-04-26 ENCOUNTER — Encounter (HOSPITAL_COMMUNITY): Payer: Self-pay | Admitting: Emergency Medicine

## 2023-04-26 ENCOUNTER — Other Ambulatory Visit: Payer: Self-pay

## 2023-04-26 DIAGNOSIS — R439 Unspecified disturbances of smell and taste: Secondary | ICD-10-CM | POA: Diagnosis not present

## 2023-04-26 LAB — CBG MONITORING, ED: Glucose-Capillary: 108 mg/dL — ABNORMAL HIGH (ref 70–99)

## 2023-04-26 MED ORDER — LEVETIRACETAM 500 MG PO TABS
500.0000 mg | ORAL_TABLET | Freq: Once | ORAL | Status: AC
  Administered 2023-04-26: 500 mg via ORAL
  Filled 2023-04-26: qty 1

## 2023-04-26 NOTE — ED Provider Notes (Signed)
 [facility] Emergency Department Provider Note MRN:  [id number]  Arrival date & time: 04/26/23     Chief Complaint   Burning Smell   History of Present Illness   Perry Day is a 38 y.o. year-old male presents to the ED with chief complaint of burning smell.  He states that he has experienced this prior to having a seizure.  He states that he is unable to access his Keppra because it is at another house.  He denies any seizures today.  States that he was recently released from jail 4 days ago.  Denies any other illnesses or symptoms at this time.  History provided by patient.   Review of Systems  Pertinent positive and negative review of systems noted in HPI.    Physical Exam   Vitals:   04/26/23 0004  BP: (!) 156/110  Pulse: 94  Resp: 18  Temp: 97.9 F (36.6 C)  SpO2: 99%    CONSTITUTIONAL:  non toxic-appearing, NAD NEURO:  Alert and oriented x 3, CN 3-12 grossly intact EYES:  eyes equal and reactive ENT/NECK:  Supple, no stridor  CARDIO:  normal rate, regular rhythm, appears well-perfused  PULM:  No respiratory distress, CTAB GI/GU:  non-distended,  MSK/SPINE:  No gross deformities, no edema, moves all extremities  SKIN:  no rash, atraumatic   *Additional and/or pertinent findings included in MDM below  Diagnostic and Interventional Summary    EKG Interpretation Date/Time:    Ventricular Rate:    PR Interval:    QRS Duration:    QT Interval:    QTC Calculation:   R Axis:      Text Interpretation:         Labs Reviewed  CBG MONITORING, ED - Abnormal; Notable for the following components:      Result Value   Glucose-Capillary 108 (*)    All other components within normal limits    No orders to display    Medications  levETIRAcetam (KEPPRA) tablet 500 mg (500 mg Oral Given 04/26/23 0022)     Procedures  /  Critical Care Procedures  ED Course and Medical Decision Making  I have reviewed the triage vital signs, the  nursing notes, and pertinent available records from the EMR.  Social Determinants Affecting Complexity of Care: Patient has no clinically significant social determinants affecting this chief complaint..   ED Course:    Medical Decision Making Patient here with burning smell sensation/aura.  No seizures today.  He is given a dose of his Keppra.  I offered to refill his Keppra, but he states that he has enough he just needs to access it.  He asks if [employer] can help him get to his house.  Uncertain what assistance we can provide with transportation, will see if nursing has any options.  Patient appears medically cleared and stable for discharge.  Risk Prescription drug management.         Consultants: No consultations were needed in caring for this patient.   Treatment and Plan: Emergency department workup does not suggest an emergent condition requiring admission or immediate intervention beyond  what has been performed at this time. The patient is safe for discharge and has  been instructed to return immediately for worsening symptoms, change in  symptoms or any other concerns    Final Clinical Impressions(s) / ED Diagnoses     ICD-10-CM   1. Smell disturbance  R43.9       ED Discharge Orders  None         Discharge Instructions Discussed with and Provided to Patient:    Discharge Instructions      Please follow-up with your doctor.      Roxy Horseman, PA-C 04/26/23 1610    Shon Baton, MD 04/26/23 [phone]

## 2023-04-26 NOTE — Discharge Instructions (Signed)
Please follow up with your doctor.

## 2023-04-26 NOTE — ED Notes (Signed)
 Provider at bedside

## 2023-04-26 NOTE — ED Triage Notes (Addendum)
 Pt in POV stating he has been without Keppra since his release from jail on 3/21. Pt reports smelling and tasting "burning smell" and gets this often prior to seizures. None reported to this point

## 2023-04-27 ENCOUNTER — Emergency Department (HOSPITAL_COMMUNITY)
Admission: EM | Admit: 2023-04-27 | Discharge: 2023-04-27 | Disposition: A | Discharge status: Home / Self Care | Attending: Emergency Medicine | Admitting: Emergency Medicine

## 2023-04-27 ENCOUNTER — Other Ambulatory Visit: Payer: Self-pay

## 2023-04-27 ENCOUNTER — Encounter (HOSPITAL_COMMUNITY): Payer: Self-pay | Admitting: Emergency Medicine

## 2023-04-27 ENCOUNTER — Other Ambulatory Visit (HOSPITAL_COMMUNITY): Payer: Self-pay

## 2023-04-27 DIAGNOSIS — Z79899 Other long term (current) drug therapy: Secondary | ICD-10-CM | POA: Insufficient documentation

## 2023-04-27 DIAGNOSIS — R431 Parosmia: Secondary | ICD-10-CM | POA: Diagnosis not present

## 2023-04-27 DIAGNOSIS — M79605 Pain in left leg: Secondary | ICD-10-CM | POA: Diagnosis not present

## 2023-04-27 DIAGNOSIS — R569 Unspecified convulsions: Secondary | ICD-10-CM | POA: Diagnosis not present

## 2023-04-27 MED ORDER — LIDOCAINE 5 % EX PTCH
1.0000 | MEDICATED_PATCH | CUTANEOUS | 0 refills | Status: AC
  Filled 2023-04-27: qty 30, 30d supply, fill #0

## 2023-04-27 MED ORDER — LIDOCAINE 5 % EX PTCH
3.0000 | MEDICATED_PATCH | CUTANEOUS | Status: DC
  Administered 2023-04-27: 3 via TRANSDERMAL
  Filled 2023-04-27: qty 3

## 2023-04-27 NOTE — ED Notes (Signed)
 MD Palumbo seeing patient in triage.
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
## 2023-04-27 NOTE — ED Notes (Signed)
 Patient uncooperative during triage, refusing to answer questions, and was witnessed taking an unknown pill despite efforts in telling him not to.  Patient refused to tell staff what pill he took.

## 2023-04-27 NOTE — ED Triage Notes (Signed)
 Patient BIB [facility] c/o "smelling smoke" for the past few days.  Patient seen at [facility] for the same thing.  EMS reports patient initially called 911 for PD to take him home to [place], and when PD stated they were unable to take him home, he requested an ambulance. Patient states he normally smells smoke when he is about to have a seizure, and "hasn't been able to take his zofran for his seizures."  Patient admits to etoh use tonight.  EMS reports patient was uncooperative with them during transport.

## 2023-04-28 ENCOUNTER — Encounter (HOSPITAL_COMMUNITY): Payer: Self-pay

## 2023-04-28 ENCOUNTER — Emergency Department (HOSPITAL_COMMUNITY)
Admission: EM | Admit: 2023-04-28 | Discharge: 2023-04-28 | Disposition: A | Discharge status: Home / Self Care | Attending: Emergency Medicine | Admitting: Emergency Medicine

## 2023-04-28 ENCOUNTER — Other Ambulatory Visit: Payer: Self-pay

## 2023-04-28 DIAGNOSIS — F10129 Alcohol abuse with intoxication, unspecified: Secondary | ICD-10-CM | POA: Diagnosis not present

## 2023-04-28 DIAGNOSIS — Z7901 Long term (current) use of anticoagulants: Secondary | ICD-10-CM | POA: Insufficient documentation

## 2023-04-28 DIAGNOSIS — R070 Pain in throat: Secondary | ICD-10-CM | POA: Diagnosis not present

## 2023-04-28 DIAGNOSIS — F10929 Alcohol use, unspecified with intoxication, unspecified: Secondary | ICD-10-CM

## 2023-04-28 DIAGNOSIS — Y908 Blood alcohol level of 240 mg/100 ml or more: Secondary | ICD-10-CM | POA: Insufficient documentation

## 2023-04-28 DIAGNOSIS — Z743 Need for continuous supervision: Secondary | ICD-10-CM | POA: Diagnosis not present

## 2023-04-28 DIAGNOSIS — R569 Unspecified convulsions: Secondary | ICD-10-CM | POA: Insufficient documentation

## 2023-04-28 DIAGNOSIS — R55 Syncope and collapse: Secondary | ICD-10-CM | POA: Diagnosis not present

## 2023-04-28 DIAGNOSIS — R410 Disorientation, unspecified: Secondary | ICD-10-CM | POA: Diagnosis not present

## 2023-04-28 LAB — ETHANOL: Alcohol, Ethyl (B): 405 mg/dL (ref <10)

## 2023-04-28 LAB — COMPREHENSIVE METABOLIC PANEL WITH GFR
ALT: 46 U/L — ABNORMAL HIGH (ref 0–44)
AST: 56 U/L — ABNORMAL HIGH (ref 15–41)
Albumin: 3.8 g/dL (ref 3.5–5.0)
Alkaline Phosphatase: 86 U/L (ref 38–126)
Anion gap: 7 (ref 5–15)
BUN: <5 mg/dL — ABNORMAL LOW (ref 6–20)
CO2: 30 mmol/L (ref 22–32)
Calcium: 8.3 mg/dL — ABNORMAL LOW (ref 8.9–10.3)
Chloride: 111 mmol/L (ref 98–111)
Creatinine, Ser: 0.97 mg/dL (ref 0.61–1.24)
GFR, Estimated: >60 mL/min (ref >60)
Glucose, Bld: 97 mg/dL (ref 70–99)
Potassium: 3.5 mmol/L (ref 3.5–5.1)
Sodium: 148 mmol/L — ABNORMAL HIGH (ref 135–145)
Total Bilirubin: 0.4 mg/dL (ref 0.0–1.2)
Total Protein: 7.4 g/dL (ref 6.5–8.1)

## 2023-04-28 LAB — CBG MONITORING, ED: Glucose-Capillary: 85 mg/dL (ref 70–99)

## 2023-04-28 LAB — CBC WITH DIFFERENTIAL/PLATELET
Abs Immature Granulocytes: 0.01 10*3/uL (ref 0.00–0.07)
Basophils Absolute: 0.1 10*3/uL (ref 0.0–0.1)
Basophils Relative: 2 %
Eosinophils Absolute: 0.1 10*3/uL (ref 0.0–0.5)
Eosinophils Relative: 2 %
HCT: 39.7 % (ref 39.0–52.0)
Hemoglobin: 12 g/dL — ABNORMAL LOW (ref 13.0–17.0)
Immature Granulocytes: 0 %
Lymphocytes Relative: 61 %
Lymphs Abs: 2.4 10*3/uL (ref 0.7–4.0)
MCH: 23 pg — ABNORMAL LOW (ref 26.0–34.0)
MCHC: 30.2 g/dL (ref 30.0–36.0)
MCV: 76.2 fL — ABNORMAL LOW (ref 80.0–100.0)
Monocytes Absolute: 0.3 10*3/uL (ref 0.1–1.0)
Monocytes Relative: 7 %
Neutro Abs: 1.1 10*3/uL — ABNORMAL LOW (ref 1.7–7.7)
Neutrophils Relative %: 28 %
Platelets: 366 10*3/uL (ref 150–400)
RBC: 5.21 MIL/uL (ref 4.22–5.81)
RDW: 18.5 % — ABNORMAL HIGH (ref 11.5–15.5)
WBC: 3.9 10*3/uL — ABNORMAL LOW (ref 4.0–10.5)
nRBC: 0 % (ref 0.0–0.2)

## 2023-04-28 MED ORDER — LEVETIRACETAM IN NACL 1500 MG/100ML IV SOLN
1500.0000 mg | Freq: Once | INTRAVENOUS | Status: AC
  Administered 2023-04-28: 1500 mg via INTRAVENOUS
  Filled 2023-04-28 (×2): qty 100
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
## 2023-04-28 NOTE — ED Notes (Signed)
 Tried to ambulate pt, refused, says legs hurt. Reproached 10 mins later and asked if the patient need to use the restroom,  he declined. So I pointed to the bathroom and said its right there if you need it to see if after I walk off if he would get up and walk to bathroom, instead went back to sleep.

## 2023-04-28 NOTE — ED Notes (Signed)
 Nt placed in Trendelenburg after blood pressure dropped. RN made aware.

## 2023-04-28 NOTE — Discharge Instructions (Addendum)
 You were seen in the ER after you had fallen at [employer]. Your blood alcohol level was significantly elevated.  It is unclear if you had a seizure.  We did give you seizure medications to the IV.  Continue to take your oral seizure medications as prescribed.
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
## 2023-04-28 NOTE — ED Provider Notes (Signed)
 [place] EMERGENCY DEPARTMENT AT [facility] Provider Note   CSN: [id number] Arrival date & time: 04/28/23  0226     History {Add pertinent medical, surgical, social history, OB history to HPI:1} Chief Complaint  Patient presents with   Seizures   Alcohol Intoxication    Perry Day is a 38 y.o. male.  Brought in by EMS for abnormal behavior. Well known to our department for alcohol intoxication and possible seizures, h/o epilepsy. Found at a [employer], decreased mental status and confusion. Endorsed etoh tonight. Denies injury. Patient not very cooperative with history taking at initial evaluation.    Seizures Alcohol Intoxication       Home Medications Prior to Admission medications   Medication Sig Start Date End Date Taking? Authorizing Provider  levETIRAcetam (KEPPRA) 500 MG tablet Take 1 tablet (500 mg total) by mouth 2 (two) times daily. 03/19/23 03/13/24  Lunette Stands, PA-C  lidocaine (LIDODERM) 5 % Place 1 patch onto the skin daily. Remove & Discard patch within 12 hours or as directed by MD. 04/27/23   Nicanor Alcon, April, MD  methocarbamol (ROBAXIN-750) 750 MG tablet Take 1 tablet (750 mg total) by mouth every 6 (six) hours as needed for muscle spasms. Patient not taking: Reported on 03/16/2023 08/15/22   Yolonda Kida, MD  ondansetron [facility]) 4 MG tablet Take 1 tablet (4 mg total) by mouth every 8 (eight) hours as needed for nausea or vomiting. 08/15/22   Yolonda Kida, MD  oxyCODONE (ROXICODONE) 5 MG immediate release tablet Take 1 tablet (5 mg total) by mouth every 4 (four) hours as needed for severe pain or breakthrough pain. Patient not taking: Reported on 03/16/2023 08/15/22 08/15/23  Yolonda Kida, MD  APIXABAN Everlene Balls) VTE STARTER PACK (10MG  AND 5MG ) Take as directed on package: start with two-5mg  tablets twice daily for 7 days. On day 8, switch to one-5mg  tablet twice daily. 04/26/20 04/26/20  Gailen Shelter, PA       Allergies    Patient has no known allergies.    Review of Systems   Review of Systems  Neurological:  Positive for seizures.    Physical Exam Updated Vital Signs BP 125/89   Pulse 70   Temp 97.6 F (36.4 C)   Resp 16   Ht 5\' 9"  (1.753 m)   Wt 73 kg   SpO2 100%   BMI 23.77 kg/m  Physical Exam Vitals and nursing note reviewed.  Constitutional:      Appearance: He is well-developed.  HENT:     Head: Normocephalic and atraumatic.  Cardiovascular:     Rate and Rhythm: Normal rate.  Pulmonary:     Effort: Pulmonary effort is normal. No respiratory distress.  Abdominal:     General: There is no distension.  Musculoskeletal:        General: Normal range of motion.     Cervical back: Normal range of motion.  Skin:    General: Skin is warm and dry.  Neurological:     Mental Status: He is alert.     ED Results / Procedures / Treatments   Labs (all labs ordered are listed, but only abnormal results are displayed) Labs Reviewed  CBC WITH DIFFERENTIAL/PLATELET - Abnormal; Notable for the following components:      Result Value   WBC 3.9 (*)    Hemoglobin 12.0 (*)    MCV 76.2 (*)    MCH 23.0 (*)    RDW 18.5 (*)  Neutro Abs 1.1 (*)    All other components within normal limits  COMPREHENSIVE METABOLIC PANEL - Abnormal; Notable for the following components:   Sodium 148 (*)    BUN <5 (*)    Calcium 8.3 (*)    AST 56 (*)    ALT 46 (*)    All other components within normal limits  ETHANOL - Abnormal; Notable for the following components:   Alcohol, Ethyl (B) 405 (*)    All other components within normal limits  LEVETIRACETAM LEVEL  CBG MONITORING, ED    EKG None  Radiology No results found.  Procedures Procedures  {Document cardiac monitor, telemetry assessment procedure when appropriate:1}  Medications Ordered in ED Medications - No data to display  ED Course/ Medical Decision Making/ A&P   {   Click here for ABCD2, HEART and other  calculatorsREFRESH Note before signing :1}                              Medical Decision Making Amount and/or Complexity of Data Reviewed Labs: ordered. ECG/medicine tests: ordered.  Significant intoxication as likely cause for his current altered mental status. Will allow to sober up and reevaluate. ***  {Document critical care time when appropriate:1} {Document review of labs and clinical decision tools ie heart score, Chads2Vasc2 etc:1}  {Document your independent review of radiology images, and any outside records:1} {Document your discussion with family members, caretakers, and with consultants:1} {Document social determinants of health affecting pt's care:1} {Document your decision making why or why not admission, treatments were needed:1} Final Clinical Impression(s) / ED Diagnoses Final diagnoses:  None    Rx / DC Orders ED Discharge Orders     None

## 2023-04-29 LAB — LEVETIRACETAM LEVEL: Levetiracetam Lvl: 14.7 ug/mL (ref 10.0–40.0)

## 2023-05-18 ENCOUNTER — Other Ambulatory Visit: Payer: Self-pay

## 2023-05-18 ENCOUNTER — Encounter (HOSPITAL_COMMUNITY): Payer: Self-pay

## 2023-05-18 ENCOUNTER — Emergency Department (HOSPITAL_COMMUNITY)
Admission: EM | Admit: 2023-05-18 | Discharge: 2023-05-19 | Discharge status: Against Medical Advice | Attending: Emergency Medicine | Admitting: Emergency Medicine

## 2023-05-18 DIAGNOSIS — Z5321 Procedure and treatment not carried out due to patient leaving prior to being seen by health care provider: Secondary | ICD-10-CM | POA: Diagnosis not present

## 2023-05-18 DIAGNOSIS — M79606 Pain in leg, unspecified: Secondary | ICD-10-CM | POA: Diagnosis not present

## 2023-05-18 DIAGNOSIS — R569 Unspecified convulsions: Secondary | ICD-10-CM | POA: Insufficient documentation

## 2023-05-18 DIAGNOSIS — R110 Nausea: Secondary | ICD-10-CM | POA: Diagnosis not present

## 2023-05-18 NOTE — ED Notes (Signed)
 Patient states he smells a burnt smell and needs to go to the front office.

## 2023-05-18 NOTE — ED Triage Notes (Addendum)
 Pt BIBA from street, c/o feeling an aura of seizure.  Pt stated when he smells a burning smell he has a seizure and has been smelling it for 30 mins. VSS. CBG 113. Also is complaining of leg pain.  During triage said he no longer smells it.  And he feels better.

## 2023-05-18 NOTE — ED Notes (Signed)
Patient has left. °

## 2023-05-19 NOTE — ED Provider Notes (Signed)
 Patient left prior to being evaluated.  Per tech was seen walking out the door.   Timmy Forbes, [place] 05/19/23 0004    Lindle Rhea, MD 05/20/23 [phone]

## 2023-06-18 ENCOUNTER — Other Ambulatory Visit: Payer: Self-pay

## 2023-06-18 ENCOUNTER — Emergency Department (HOSPITAL_COMMUNITY)

## 2023-06-18 ENCOUNTER — Emergency Department (HOSPITAL_COMMUNITY)
Admission: EM | Admit: 2023-06-18 | Discharge: 2023-06-18 | Disposition: A | Discharge status: Home / Self Care | Attending: Emergency Medicine | Admitting: Emergency Medicine

## 2023-06-18 ENCOUNTER — Encounter (HOSPITAL_COMMUNITY): Payer: Self-pay | Admitting: Emergency Medicine

## 2023-06-18 DIAGNOSIS — S0990XA Unspecified injury of head, initial encounter: Secondary | ICD-10-CM | POA: Diagnosis not present

## 2023-06-18 DIAGNOSIS — M25552 Pain in left hip: Secondary | ICD-10-CM | POA: Diagnosis not present

## 2023-06-18 DIAGNOSIS — Z7901 Long term (current) use of anticoagulants: Secondary | ICD-10-CM | POA: Diagnosis not present

## 2023-06-18 DIAGNOSIS — Z23 Encounter for immunization: Secondary | ICD-10-CM | POA: Insufficient documentation

## 2023-06-18 DIAGNOSIS — G9389 Other specified disorders of brain: Secondary | ICD-10-CM | POA: Diagnosis not present

## 2023-06-18 DIAGNOSIS — S52602A Unspecified fracture of lower end of left ulna, initial encounter for closed fracture: Secondary | ICD-10-CM | POA: Insufficient documentation

## 2023-06-18 DIAGNOSIS — S0101XA Laceration without foreign body of scalp, initial encounter: Secondary | ICD-10-CM | POA: Insufficient documentation

## 2023-06-18 DIAGNOSIS — S6992XA Unspecified injury of left wrist, hand and finger(s), initial encounter: Secondary | ICD-10-CM | POA: Diagnosis present

## 2023-06-18 DIAGNOSIS — S52502A Unspecified fracture of the lower end of left radius, initial encounter for closed fracture: Secondary | ICD-10-CM | POA: Diagnosis not present

## 2023-06-18 DIAGNOSIS — S52252A Displaced comminuted fracture of shaft of ulna, left arm, initial encounter for closed fracture: Secondary | ICD-10-CM | POA: Diagnosis not present

## 2023-06-18 DIAGNOSIS — S79912A Unspecified injury of left hip, initial encounter: Secondary | ICD-10-CM | POA: Diagnosis not present

## 2023-06-18 DIAGNOSIS — S0003XA Contusion of scalp, initial encounter: Secondary | ICD-10-CM | POA: Diagnosis not present

## 2023-06-18 DIAGNOSIS — M1612 Unilateral primary osteoarthritis, left hip: Secondary | ICD-10-CM | POA: Diagnosis not present

## 2023-06-18 DIAGNOSIS — R58 Hemorrhage, not elsewhere classified: Secondary | ICD-10-CM | POA: Diagnosis not present

## 2023-06-18 MED ORDER — LEVETIRACETAM 500 MG PO TABS
500.0000 mg | ORAL_TABLET | Freq: Once | ORAL | Status: AC
  Administered 2023-06-18: 500 mg via ORAL
  Filled 2023-06-18: qty 1

## 2023-06-18 MED ORDER — OXYCODONE HCL 5 MG PO TABS: 5.0000 mg | ORAL_TABLET | ORAL | 0 refills | Status: DC | PRN

## 2023-06-18 MED ORDER — OXYCODONE-ACETAMINOPHEN 5-325 MG PO TABS
1.0000 | ORAL_TABLET | Freq: Once | ORAL | Status: AC
  Administered 2023-06-18: 1 via ORAL
  Filled 2023-06-18: qty 1

## 2023-06-18 MED ORDER — TETANUS-DIPHTH-ACELL PERTUSSIS 5-2.5-18.5 LF-MCG/0.5 IM SUSY
0.5000 mL | PREFILLED_SYRINGE | Freq: Once | INTRAMUSCULAR | Status: AC
  Administered 2023-06-18: 0.5 mL via INTRAMUSCULAR
  Filled 2023-06-18: qty 0.5

## 2023-06-18 MED ORDER — NICOTINE 7 MG/24HR TD PT24
7.0000 mg | MEDICATED_PATCH | Freq: Once | TRANSDERMAL | Status: DC
  Filled 2023-06-18: qty 1

## 2023-06-18 NOTE — ED Notes (Signed)
Ortho called for splint  

## 2023-06-18 NOTE — Progress Notes (Signed)
 Orthopedic Tech Progress Note Patient Details:  Perry Day 09-10-1985 [id number]  Ortho Devices Type of Ortho Device: Arm sling, Volar splint Ortho Device/Splint Location: LUE Ortho Device/Splint Interventions: Application   Post Interventions Patient Tolerated: Well  Perry Day 06/18/2023, 1:15 PM
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
## 2023-06-18 NOTE — Discharge Instructions (Addendum)
 It was pleasure caring for you today.  Please make sure to call Dr. Alfred Imperial office first thing in the morning on Monday for a follow-up appointment.  Seek emergency care if experiencing any new or worsening symptoms.  Please take Advil and Tylenol  for initial pain management and you can use oxycodone  for breakthrough pain.  You also need to return to urgent care in around 10 days to have your staples removed. Failure to get these staples removes can result in infections and other complications. You may shower as normal. Please do not soak your laceration in a tub or pool for extended periods of time.  Alternating between 650 mg Tylenol  and 400 mg Advil: The best way to alternate taking Acetaminophen  (example Tylenol ) and Ibuprofen (example Advil/Motrin) is to take them 3 hours apart. For example, if you take ibuprofen at 6 am you can then take Tylenol  at 9 am. You can continue this regimen throughout the day, making sure you do not exceed the recommended maximum dose for each drug.

## 2023-06-18 NOTE — ED Notes (Signed)
 Patient transported to X-ray

## 2023-06-19 DIAGNOSIS — S52692A Other fracture of lower end of left ulna, initial encounter for closed fracture: Secondary | ICD-10-CM | POA: Diagnosis not present

## 2023-06-27 ENCOUNTER — Encounter (HOSPITAL_COMMUNITY): Payer: Self-pay

## 2023-06-27 ENCOUNTER — Other Ambulatory Visit (HOSPITAL_COMMUNITY): Payer: Self-pay

## 2023-06-27 ENCOUNTER — Emergency Department (HOSPITAL_COMMUNITY)
Admission: EM | Admit: 2023-06-27 | Discharge: 2023-06-27 | Disposition: A | Discharge status: Home / Self Care | Attending: Emergency Medicine | Admitting: Emergency Medicine

## 2023-06-27 ENCOUNTER — Other Ambulatory Visit: Payer: Self-pay

## 2023-06-27 DIAGNOSIS — Z760 Encounter for issue of repeat prescription: Secondary | ICD-10-CM | POA: Insufficient documentation

## 2023-06-27 DIAGNOSIS — Z4802 Encounter for removal of sutures: Secondary | ICD-10-CM | POA: Insufficient documentation

## 2023-06-27 DIAGNOSIS — L089 Local infection of the skin and subcutaneous tissue, unspecified: Secondary | ICD-10-CM | POA: Insufficient documentation

## 2023-06-27 DIAGNOSIS — F10929 Alcohol use, unspecified with intoxication, unspecified: Secondary | ICD-10-CM | POA: Diagnosis not present

## 2023-06-27 DIAGNOSIS — Z4789 Encounter for other orthopedic aftercare: Secondary | ICD-10-CM | POA: Insufficient documentation

## 2023-06-27 DIAGNOSIS — Z7901 Long term (current) use of anticoagulants: Secondary | ICD-10-CM | POA: Diagnosis not present

## 2023-06-27 DIAGNOSIS — R4182 Altered mental status, unspecified: Secondary | ICD-10-CM | POA: Diagnosis not present

## 2023-06-27 MED ORDER — CEPHALEXIN 500 MG PO CAPS
500.0000 mg | ORAL_CAPSULE | Freq: Once | ORAL | Status: AC
  Administered 2023-06-27: 500 mg via ORAL
  Filled 2023-06-27: qty 1

## 2023-06-27 MED ORDER — CEPHALEXIN 500 MG PO CAPS
500.0000 mg | ORAL_CAPSULE | Freq: Four times a day (QID) | ORAL | 0 refills | Status: DC
  Filled 2023-06-27: qty 28, 7d supply, fill #0

## 2023-06-27 MED ORDER — LEVETIRACETAM 500 MG PO TABS
1000.0000 mg | ORAL_TABLET | Freq: Once | ORAL | Status: AC
  Administered 2023-06-27: 1000 mg via ORAL
  Filled 2023-06-27: qty 2

## 2023-06-27 MED ORDER — LEVETIRACETAM 500 MG PO TABS: 500.0000 mg | ORAL_TABLET | Freq: Two times a day (BID) | ORAL | 0 refills | Status: DC

## 2023-06-27 MED ORDER — LEVETIRACETAM 500 MG PO TABS
500.0000 mg | ORAL_TABLET | Freq: Two times a day (BID) | ORAL | 0 refills | Status: DC
  Filled 2023-06-27: qty 60, 30d supply, fill #0

## 2023-06-27 MED ORDER — CEPHALEXIN 500 MG PO CAPS: 500.0000 mg | ORAL_CAPSULE | Freq: Four times a day (QID) | ORAL | 0 refills | Status: AC

## 2023-06-27 NOTE — Discharge Instructions (Addendum)
 Follow-up with orthopedics tomorrow as scheduled.  Take Keflex  as prescribed and complete the full course for concern for infection in your wound on your scalp.  Take Keppra  as prescribed.  Please follow-up with your primary care provider for further refills.  If you do not have a primary care provider, please contact [facility].
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
## 2023-06-30 ENCOUNTER — Encounter (HOSPITAL_COMMUNITY): Payer: Self-pay | Admitting: Emergency Medicine

## 2023-06-30 ENCOUNTER — Emergency Department (HOSPITAL_COMMUNITY)
Admission: EM | Admit: 2023-06-30 | Discharge: 2023-06-30 | Disposition: A | Discharge status: Home / Self Care | Attending: Emergency Medicine | Admitting: Emergency Medicine

## 2023-06-30 ENCOUNTER — Other Ambulatory Visit: Payer: Self-pay

## 2023-06-30 DIAGNOSIS — Z743 Need for continuous supervision: Secondary | ICD-10-CM | POA: Diagnosis not present

## 2023-06-30 DIAGNOSIS — R609 Edema, unspecified: Secondary | ICD-10-CM | POA: Diagnosis not present

## 2023-06-30 DIAGNOSIS — M79602 Pain in left arm: Secondary | ICD-10-CM | POA: Diagnosis not present

## 2023-06-30 DIAGNOSIS — L299 Pruritus, unspecified: Secondary | ICD-10-CM | POA: Diagnosis not present

## 2023-06-30 NOTE — ED Notes (Signed)
 Ok for lobby per [place], [place]
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
## 2023-06-30 NOTE — ED Notes (Signed)
 AVS provided by edp was reviewed with pt. Pt verbalized understanding with no additional questions at this time.

## 2023-06-30 NOTE — Progress Notes (Signed)
 Orthopedic Tech Progress Note Patient Details:  Perry Day 24-Sep-1985 [id number]  Ortho Devices Type of Ortho Device: Ace wrap, Cotton web roll, Sugartong splint Ortho Device/Splint Location: LUE Ortho Device/Splint Interventions: Ordered, Adjustment, Application   Post Interventions Patient Tolerated: Fair Instructions Provided: Care of device  MD called from ED to inform me patient needed a new splint applied due to the splint got wet and begun to create a smell and visibly dirty. I applied a sugar tong instead of the original volar splint for better support and finger space. Patient felt the splint was better and wasn't as tight as the previous splint but was unaware you were not supposed to get it wet. I gave him examples on how you're supposed to keep it dry when bathing.   [name] 06/30/2023, 6:38 AM
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
## 2023-07-10 ENCOUNTER — Emergency Department (HOSPITAL_COMMUNITY)
Admission: EM | Admit: 2023-07-10 | Discharge: 2023-07-11 | Disposition: A | Discharge status: Home / Self Care | Attending: Emergency Medicine | Admitting: Emergency Medicine

## 2023-07-10 ENCOUNTER — Emergency Department (HOSPITAL_COMMUNITY)

## 2023-07-10 ENCOUNTER — Other Ambulatory Visit: Payer: Self-pay

## 2023-07-10 ENCOUNTER — Encounter (HOSPITAL_COMMUNITY): Payer: Self-pay

## 2023-07-10 DIAGNOSIS — Z4789 Encounter for other orthopedic aftercare: Secondary | ICD-10-CM | POA: Insufficient documentation

## 2023-07-10 DIAGNOSIS — S52232A Displaced oblique fracture of shaft of left ulna, initial encounter for closed fracture: Secondary | ICD-10-CM | POA: Diagnosis not present

## 2023-07-10 DIAGNOSIS — Z4689 Encounter for fitting and adjustment of other specified devices: Secondary | ICD-10-CM | POA: Diagnosis not present
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
## 2023-07-11 MED ORDER — OXYCODONE-ACETAMINOPHEN 5-325 MG PO TABS
2.0000 | ORAL_TABLET | Freq: Once | ORAL | Status: AC
  Administered 2023-07-11: 2 via ORAL
  Filled 2023-07-11: qty 2

## 2023-07-11 NOTE — Progress Notes (Signed)
 Orthopedic Tech Progress Note Patient Details:  Perry Day 07/04/1985 [id number]  Ortho Devices Type of Ortho Device: Sugartong splint Ortho Device/Splint Location: lue Ortho Device/Splint Interventions: Ordered, Application, Adjustment  I replaced the sugartong. The pt already had an arm sling and didn't want another one. Post Interventions Patient Tolerated: Well Instructions Provided: Care of device, Adjustment of device  [name] 07/11/2023, 3:08 AM

## 2023-07-11 NOTE — ED Provider Notes (Signed)
  [facility] Emergency Department Provider Note MRN:  [id number]  Arrival date & time: 07/11/23     Chief Complaint   Follow-up   History of Present Illness   Perry Day is a 38 y.o. year-old male presents to the ED with chief complaint of request splint exchange.  He states that he has a left wrist fracture.  He has not been able to follow-up with orthopedics due to financial reasons.  He states that he needs his splint changed because it is uncomfortable.  Denies any other new complaints..  History provided by patient.   Review of Systems  Pertinent positive and negative review of systems noted in HPI.    Physical Exam   Vitals:   07/10/23 2121  BP: 113/74  Pulse: 96  Resp: 16  Temp: 98.5 F (36.9 C)  SpO2: 98%    CONSTITUTIONAL:  non toxic-appearing, NAD NEURO:  Alert and oriented x 3, CN 3-12 grossly intact EYES:  eyes equal and reactive ENT/NECK:  Supple, no stridor  CARDIO:  normal rate, appears well-perfused  PULM:  No respiratory distress,  GI/GU:  non-distended,  MSK/SPINE:  No gross deformities, no edema, moves all extremities, left forearm splinted with sugar tong, TTP SKIN:  no rash, atraumatic   *Additional and/or pertinent findings included in MDM below  Diagnostic and Interventional Summary    EKG Interpretation Date/Time:    Ventricular Rate:    PR Interval:    QRS Duration:    QT Interval:    QTC Calculation:   R Axis:      Text Interpretation:         Labs Reviewed - No data to display  DG Forearm Left  Final Result      Medications  oxyCODONE -acetaminophen  (PERCOCET/ROXICET) 5-325 MG per tablet 2 tablet (has no administration in time range)     Procedures  /  Critical Care Procedures  ED Course and Medical Decision Making  I have reviewed the triage vital signs, the nursing notes, and pertinent available records from the EMR.  Social Determinants Affecting Complexity of Care: Patient has no  clinically significant social determinants affecting this chief complaint..   ED Course:    Medical Decision Making Patient here for splint exchange.  His fracture remains persistent.  He is advised to follow-up with hand surgery.  Splint was exchanged and patient provided with a pain pill and sling.  TOC consulted for follow-up assistance.  Amount and/or Complexity of Data Reviewed Radiology: ordered.  Risk Prescription drug management.         Consultants: No consultations were needed in caring for this patient.   Treatment and Plan: Emergency department workup does not suggest an emergent condition requiring admission or immediate intervention beyond  what has been performed at this time. The patient is safe for discharge and has  been instructed to return immediately for worsening symptoms, change in  symptoms or any other concerns    Final Clinical Impressions(s) / ED Diagnoses     ICD-10-CM   1. Aftercare for cast or splint check or change  Z47.89       ED Discharge Orders     None         Discharge Instructions Discussed with and Provided to Patient:     Discharge Instructions      Please follow-up with the hand doctor.      Sherel Dikes, PA-C 07/11/23 7829    Earma Gloss, MD 07/11/23 [phone]

## 2023-07-11 NOTE — Discharge Instructions (Signed)
 Please follow-up with the hand doctor.
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
## 2023-07-11 NOTE — ED Notes (Signed)
Ortho tech called and made aware of need for splint and sling
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
## 2023-07-22 ENCOUNTER — Other Ambulatory Visit: Payer: Self-pay

## 2023-07-22 ENCOUNTER — Emergency Department (HOSPITAL_COMMUNITY)
Admission: EM | Admit: 2023-07-22 | Discharge: 2023-07-23 | Discharge status: Against Medical Advice | Attending: Emergency Medicine | Admitting: Emergency Medicine

## 2023-07-22 DIAGNOSIS — S299XXA Unspecified injury of thorax, initial encounter: Secondary | ICD-10-CM | POA: Diagnosis not present

## 2023-07-22 DIAGNOSIS — Z4689 Encounter for fitting and adjustment of other specified devices: Secondary | ICD-10-CM | POA: Diagnosis not present

## 2023-07-22 DIAGNOSIS — Z5321 Procedure and treatment not carried out due to patient leaving prior to being seen by health care provider: Secondary | ICD-10-CM | POA: Diagnosis not present

## 2023-07-22 DIAGNOSIS — R0789 Other chest pain: Secondary | ICD-10-CM | POA: Diagnosis not present

## 2023-07-22 DIAGNOSIS — R079 Chest pain, unspecified: Secondary | ICD-10-CM | POA: Diagnosis not present

## 2023-07-22 NOTE — ED Notes (Signed)
Pt refusing vital signs. RN aware.

## 2023-07-22 NOTE — ED Triage Notes (Addendum)
 Patient requesting left forearm splint replacement , states soiled with water and dried tape . Patient refused vital signs at triage .

## 2023-07-23 ENCOUNTER — Emergency Department (HOSPITAL_COMMUNITY)
Admission: EM | Admit: 2023-07-23 | Discharge: 2023-07-23 | Disposition: A | Discharge status: Home / Self Care | Source: Home / Self Care | Attending: Emergency Medicine | Admitting: Emergency Medicine

## 2023-07-23 ENCOUNTER — Emergency Department (HOSPITAL_COMMUNITY)
Admission: EM | Admit: 2023-07-23 | Discharge: 2023-07-23 | Discharge status: Against Medical Advice | Attending: Emergency Medicine | Admitting: Emergency Medicine

## 2023-07-23 ENCOUNTER — Emergency Department (HOSPITAL_COMMUNITY)

## 2023-07-23 ENCOUNTER — Encounter (HOSPITAL_COMMUNITY): Payer: Self-pay

## 2023-07-23 DIAGNOSIS — R0789 Other chest pain: Secondary | ICD-10-CM | POA: Diagnosis not present

## 2023-07-23 DIAGNOSIS — R079 Chest pain, unspecified: Secondary | ICD-10-CM | POA: Insufficient documentation

## 2023-07-23 DIAGNOSIS — Z743 Need for continuous supervision: Secondary | ICD-10-CM | POA: Diagnosis not present

## 2023-07-23 DIAGNOSIS — S299XXA Unspecified injury of thorax, initial encounter: Secondary | ICD-10-CM | POA: Diagnosis not present

## 2023-07-23 DIAGNOSIS — S3981XA Other specified injuries of abdomen, initial encounter: Secondary | ICD-10-CM | POA: Diagnosis not present

## 2023-07-23 DIAGNOSIS — S3991XA Unspecified injury of abdomen, initial encounter: Secondary | ICD-10-CM | POA: Diagnosis not present

## 2023-07-23 DIAGNOSIS — Z5321 Procedure and treatment not carried out due to patient leaving prior to being seen by health care provider: Secondary | ICD-10-CM | POA: Insufficient documentation

## 2023-07-23 MED ORDER — OXYCODONE HCL 5 MG PO TABS
5.0000 mg | ORAL_TABLET | Freq: Once | ORAL | Status: AC
  Administered 2023-07-23: 5 mg via ORAL
  Filled 2023-07-23: qty 1

## 2023-07-23 MED ORDER — IBUPROFEN 400 MG PO TABS
600.0000 mg | ORAL_TABLET | Freq: Once | ORAL | Status: AC
  Administered 2023-07-23: 600 mg via ORAL
  Filled 2023-07-23: qty 1
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
## 2023-07-23 NOTE — ED Notes (Signed)
 Pt has not been answering to multiple calls by registration and is now not answering to calls for room
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
## 2023-07-23 NOTE — ED Notes (Signed)
 Pt became verbally aggressive when being told he would have to wait in the lobby for a room. Pt demanding somewhere to lay down. Pt informed no beds were available at this time and he would have to wait. Pt then told writer  to  shut the fuck up and and stop telling him the same thing over and over.

## 2023-07-23 NOTE — ED Notes (Signed)
 Pt was called to come to a room, he was not present. Rechecked 10 min later and has not returned.

## 2023-08-05 ENCOUNTER — Emergency Department (HOSPITAL_BASED_OUTPATIENT_CLINIC_OR_DEPARTMENT_OTHER): Admitting: Radiology

## 2023-08-05 ENCOUNTER — Encounter (HOSPITAL_BASED_OUTPATIENT_CLINIC_OR_DEPARTMENT_OTHER): Payer: Self-pay

## 2023-08-05 ENCOUNTER — Emergency Department (HOSPITAL_BASED_OUTPATIENT_CLINIC_OR_DEPARTMENT_OTHER)
Admission: EM | Admit: 2023-08-05 | Discharge: 2023-08-05 | Disposition: A | Discharge status: Home / Self Care | Attending: Emergency Medicine | Admitting: Emergency Medicine

## 2023-08-05 ENCOUNTER — Other Ambulatory Visit: Payer: Self-pay

## 2023-08-05 DIAGNOSIS — M545 Low back pain, unspecified: Secondary | ICD-10-CM | POA: Diagnosis not present

## 2023-08-05 DIAGNOSIS — M25552 Pain in left hip: Secondary | ICD-10-CM | POA: Diagnosis not present

## 2023-08-05 DIAGNOSIS — Z7901 Long term (current) use of anticoagulants: Secondary | ICD-10-CM | POA: Insufficient documentation

## 2023-08-05 DIAGNOSIS — M546 Pain in thoracic spine: Secondary | ICD-10-CM | POA: Insufficient documentation

## 2023-08-05 DIAGNOSIS — Y92410 Unspecified street and highway as the place of occurrence of the external cause: Secondary | ICD-10-CM | POA: Diagnosis not present

## 2023-08-05 DIAGNOSIS — Z041 Encounter for examination and observation following transport accident: Secondary | ICD-10-CM | POA: Diagnosis not present

## 2023-08-05 DIAGNOSIS — M542 Cervicalgia: Secondary | ICD-10-CM | POA: Diagnosis not present

## 2023-08-05 DIAGNOSIS — M25512 Pain in left shoulder: Secondary | ICD-10-CM | POA: Diagnosis not present

## 2023-08-05 MED ORDER — CELECOXIB 200 MG PO CAPS: 200.0000 mg | ORAL_CAPSULE | Freq: Two times a day (BID) | ORAL | 0 refills | Status: AC | PRN

## 2023-08-05 MED ORDER — CYCLOBENZAPRINE HCL 10 MG PO TABS: 10.0000 mg | ORAL_TABLET | Freq: Two times a day (BID) | ORAL | 0 refills | Status: AC | PRN
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
## 2023-08-05 NOTE — Discharge Instructions (Addendum)
 As discussed, your x-ray appeared normal.  No obvious broken or dislocated bones.  Will send in with anti-inflammatories as well as muscle laxer to use as needed.  Muscle laxer can cause drowsiness so please do not drive or perform any high risk activity and utilize its effects on you.  Expect to feel worse over the next 2 to 3 days before you begin to feel better.  Recommend follow-up with your primary care for reassessment.  Please do not hesitate to return to the emergency department if the worrisome signs and symptoms we discussed become apparent.

## 2023-09-15 ENCOUNTER — Ambulatory Visit: Payer: 59 | Admitting: Neurology

## 2023-09-15 ENCOUNTER — Encounter: Payer: Self-pay | Admitting: Neurology

## 2023-09-22 ENCOUNTER — Emergency Department (HOSPITAL_COMMUNITY)
Admission: EM | Admit: 2023-09-22 | Discharge: 2023-09-23 | Discharge status: Against Medical Advice | Attending: Emergency Medicine | Admitting: Emergency Medicine

## 2023-09-22 ENCOUNTER — Emergency Department (HOSPITAL_COMMUNITY)

## 2023-09-22 ENCOUNTER — Other Ambulatory Visit: Payer: Self-pay

## 2023-09-22 DIAGNOSIS — Z5321 Procedure and treatment not carried out due to patient leaving prior to being seen by health care provider: Secondary | ICD-10-CM | POA: Diagnosis not present

## 2023-09-22 DIAGNOSIS — W2209XA Striking against other stationary object, initial encounter: Secondary | ICD-10-CM | POA: Diagnosis not present

## 2023-09-22 DIAGNOSIS — M7989 Other specified soft tissue disorders: Secondary | ICD-10-CM | POA: Insufficient documentation

## 2023-09-22 DIAGNOSIS — S62306A Unspecified fracture of fifth metacarpal bone, right hand, initial encounter for closed fracture: Secondary | ICD-10-CM | POA: Diagnosis not present
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
## 2023-09-22 NOTE — ED Notes (Signed)
Patient was called 3 times no answer. 

## 2023-09-22 NOTE — ED Notes (Signed)
Pt 2x no answer 

## 2023-10-23 ENCOUNTER — Other Ambulatory Visit: Payer: Self-pay

## 2023-10-23 ENCOUNTER — Encounter (HOSPITAL_COMMUNITY): Payer: Self-pay

## 2023-10-23 ENCOUNTER — Other Ambulatory Visit (HOSPITAL_COMMUNITY): Payer: Self-pay

## 2023-10-23 ENCOUNTER — Emergency Department (HOSPITAL_COMMUNITY)
Admission: EM | Admit: 2023-10-23 | Discharge: 2023-10-23 | Disposition: A | Discharge status: Home / Self Care | Attending: Emergency Medicine | Admitting: Emergency Medicine

## 2023-10-23 DIAGNOSIS — S40861A Insect bite (nonvenomous) of right upper arm, initial encounter: Secondary | ICD-10-CM | POA: Diagnosis not present

## 2023-10-23 DIAGNOSIS — W57XXXA Bitten or stung by nonvenomous insect and other nonvenomous arthropods, initial encounter: Secondary | ICD-10-CM | POA: Diagnosis not present

## 2023-10-23 DIAGNOSIS — R21 Rash and other nonspecific skin eruption: Secondary | ICD-10-CM | POA: Diagnosis present

## 2023-10-23 DIAGNOSIS — S40862A Insect bite (nonvenomous) of left upper arm, initial encounter: Secondary | ICD-10-CM | POA: Insufficient documentation

## 2023-10-23 DIAGNOSIS — S40869A Insect bite (nonvenomous) of unspecified upper arm, initial encounter: Secondary | ICD-10-CM

## 2023-10-23 MED ORDER — PREDNISONE 50 MG PO TABS
ORAL_TABLET | ORAL | 0 refills | Status: DC
  Filled 2023-10-23: qty 4, fill #0

## 2023-10-23 MED ORDER — PREDNISONE 50 MG PO TABS: ORAL_TABLET | ORAL | 0 refills | Status: DC

## 2023-10-23 MED ORDER — DIPHENHYDRAMINE HCL 25 MG PO CAPS
25.0000 mg | ORAL_CAPSULE | Freq: Four times a day (QID) | ORAL | 0 refills | Status: DC | PRN
  Filled 2023-10-23: qty 30, 8d supply, fill #0

## 2023-10-23 MED ORDER — PREDNISONE 20 MG PO TABS
60.0000 mg | ORAL_TABLET | Freq: Once | ORAL | Status: AC
  Administered 2023-10-23: 60 mg via ORAL
  Filled 2023-10-23: qty 3

## 2023-10-23 MED ORDER — DIPHENHYDRAMINE HCL 25 MG PO CAPS: 25.0000 mg | ORAL_CAPSULE | Freq: Four times a day (QID) | ORAL | 0 refills | Status: DC | PRN

## 2023-10-23 MED ORDER — DIPHENHYDRAMINE HCL 25 MG PO CAPS
25.0000 mg | ORAL_CAPSULE | Freq: Once | ORAL | Status: AC
  Administered 2023-10-23: 25 mg via ORAL
  Filled 2023-10-23: qty 1

## 2023-10-23 MED ORDER — LEVETIRACETAM 500 MG PO TABS
500.0000 mg | ORAL_TABLET | Freq: Once | ORAL | Status: AC
  Administered 2023-10-23: 500 mg via ORAL
  Filled 2023-10-23: qty 1
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
## 2023-10-24 ENCOUNTER — Other Ambulatory Visit (HOSPITAL_COMMUNITY): Payer: Self-pay

## 2023-10-25 ENCOUNTER — Other Ambulatory Visit (HOSPITAL_COMMUNITY): Payer: Self-pay

## 2023-10-25 ENCOUNTER — Other Ambulatory Visit: Payer: Self-pay | Admitting: Physician Assistant

## 2023-10-26 ENCOUNTER — Other Ambulatory Visit (HOSPITAL_COMMUNITY): Payer: Self-pay

## 2023-10-26 MED ORDER — DIPHENHYDRAMINE HCL 25 MG PO TABS
25.0000 mg | ORAL_TABLET | Freq: Four times a day (QID) | ORAL | 0 refills | Status: AC | PRN
  Filled 2023-10-26: qty 30, 8d supply, fill #0

## 2023-10-26 MED ORDER — PREDNISONE 50 MG PO TABS
50.0000 mg | ORAL_TABLET | Freq: Every day | ORAL | 0 refills | Status: DC
  Filled 2023-10-26: qty 4, 4d supply, fill #0

## 2023-10-26 NOTE — ED Notes (Cosign Needed)
 Pharmacy change   Flint Sonny POUR, PA-C 10/26/23 1537
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
## 2023-11-07 ENCOUNTER — Other Ambulatory Visit: Payer: Self-pay

## 2023-11-07 ENCOUNTER — Emergency Department (HOSPITAL_COMMUNITY)

## 2023-11-07 ENCOUNTER — Other Ambulatory Visit (HOSPITAL_COMMUNITY): Payer: Self-pay

## 2023-11-07 ENCOUNTER — Emergency Department (HOSPITAL_COMMUNITY)
Admission: EM | Admit: 2023-11-07 | Discharge: 2023-11-07 | Disposition: A | Discharge status: Home / Self Care | Attending: Emergency Medicine | Admitting: Emergency Medicine

## 2023-11-07 DIAGNOSIS — S01511A Laceration without foreign body of lip, initial encounter: Secondary | ICD-10-CM | POA: Insufficient documentation

## 2023-11-07 MED ORDER — CEPHALEXIN 500 MG PO CAPS
500.0000 mg | ORAL_CAPSULE | Freq: Two times a day (BID) | ORAL | 0 refills | Status: DC
  Filled 2023-11-07: qty 14, 7d supply, fill #0

## 2023-11-07 MED ORDER — LIDOCAINE-EPINEPHRINE (PF) 2 %-1:200000 IJ SOLN
20.0000 mL | Freq: Once | INTRAMUSCULAR | Status: AC
  Administered 2023-11-07: 20 mL
  Filled 2023-11-07: qty 20

## 2023-11-07 MED ORDER — ACETAMINOPHEN 500 MG PO TABS
1000.0000 mg | ORAL_TABLET | Freq: Once | ORAL | Status: DC
Start: 2023-11-07 — End: 2023-11-07
  Filled 2023-11-07: qty 2

## 2023-11-07 MED ORDER — LEVETIRACETAM 500 MG PO TABS
500.0000 mg | ORAL_TABLET | Freq: Once | ORAL | Status: AC
  Administered 2023-11-07: 500 mg via ORAL
  Filled 2023-11-07: qty 1

## 2023-11-07 MED ORDER — CEPHALEXIN 250 MG PO CAPS
500.0000 mg | ORAL_CAPSULE | Freq: Once | ORAL | Status: AC
  Administered 2023-11-07: 500 mg via ORAL
  Filled 2023-11-07: qty 2
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
## 2023-11-07 NOTE — Discharge Instructions (Addendum)
 The sutures will dissolve on their own.  Please take antibiotics as directed.  Return for new or worsening symptoms.  Please follow-up with your regular doctor.

## 2023-11-12 ENCOUNTER — Telehealth: Payer: Self-pay
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
## 2023-11-17 ENCOUNTER — Other Ambulatory Visit (HOSPITAL_COMMUNITY): Payer: Self-pay

## 2023-12-09 ENCOUNTER — Emergency Department (HOSPITAL_COMMUNITY)
Admission: EM | Admit: 2023-12-09 | Discharge: 2023-12-10 | Discharge status: Against Medical Advice | Attending: Emergency Medicine | Admitting: Emergency Medicine

## 2023-12-09 DIAGNOSIS — R220 Localized swelling, mass and lump, head: Secondary | ICD-10-CM | POA: Diagnosis present

## 2023-12-09 DIAGNOSIS — Z5321 Procedure and treatment not carried out due to patient leaving prior to being seen by health care provider: Secondary | ICD-10-CM | POA: Diagnosis not present

## 2023-12-09 DIAGNOSIS — K0889 Other specified disorders of teeth and supporting structures: Secondary | ICD-10-CM | POA: Insufficient documentation

## 2023-12-09 NOTE — ED Triage Notes (Signed)
 Pt arrived via POV c/o dental pain right upper side. 8/10 on pain scale

## 2023-12-10 ENCOUNTER — Other Ambulatory Visit: Payer: Self-pay

## 2023-12-10 ENCOUNTER — Encounter (HOSPITAL_COMMUNITY): Payer: Self-pay | Admitting: *Deleted

## 2023-12-10 NOTE — ED Notes (Signed)
 The pt is sleeping in the treatment room  will not wake up for questions
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
## 2023-12-11 ENCOUNTER — Ambulatory Visit (HOSPITAL_COMMUNITY)
Admission: EM | Admit: 2023-12-11 | Discharge: 2023-12-11 | Disposition: A | Discharge status: Home / Self Care | Attending: Internal Medicine | Admitting: Internal Medicine

## 2023-12-11 ENCOUNTER — Encounter (HOSPITAL_COMMUNITY): Payer: Self-pay | Admitting: Emergency Medicine

## 2023-12-11 DIAGNOSIS — R220 Localized swelling, mass and lump, head: Secondary | ICD-10-CM | POA: Diagnosis not present

## 2023-12-11 DIAGNOSIS — K047 Periapical abscess without sinus: Secondary | ICD-10-CM | POA: Diagnosis not present

## 2023-12-11 MED ORDER — AMOXICILLIN-POT CLAVULANATE 875-125 MG PO TABS: 1.0000 | ORAL_TABLET | Freq: Two times a day (BID) | ORAL | 0 refills | Status: DC
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
## 2023-12-11 NOTE — Discharge Instructions (Addendum)
 Your dental pain is likely due to dental infection. Take  antibiotic as prescribed for the next 7 days to treat your dental infection. Continue use of over the counter medications as needed with food for dental inflammation and pain like aleve .  Perform salt water gargles every 3-4 hours.  Schedule an appointment with one of the dentists on the list provided to urgent care today.  If you develop any new or worsening symptoms or if your symptoms do not start to improve, pleases return here or follow-up with your primary care provider. If your symptoms are severe, please go to the emergency room.

## 2023-12-11 NOTE — ED Triage Notes (Signed)
 Pt c/o dental pain on right side for a couple days but swelling on right side of face wore this morning. Taking Aleve 

## 2023-12-25 ENCOUNTER — Emergency Department (HOSPITAL_COMMUNITY)
Admission: EM | Admit: 2023-12-25 | Discharge: 2023-12-25 | Discharge status: Against Medical Advice | Attending: Emergency Medicine | Admitting: Emergency Medicine

## 2023-12-25 DIAGNOSIS — Z5321 Procedure and treatment not carried out due to patient leaving prior to being seen by health care provider: Secondary | ICD-10-CM | POA: Insufficient documentation

## 2023-12-25 DIAGNOSIS — R451 Restlessness and agitation: Secondary | ICD-10-CM | POA: Diagnosis present

## 2023-12-25 MED ORDER — LIDOCAINE HCL (PF) 1 % IJ SOLN
INTRAMUSCULAR | Status: AC
  Filled 2023-12-25: qty 30
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
## 2023-12-25 NOTE — ED Notes (Signed)
 Pt stated he is leaving.  Unable to convince pt to stay.  Ambulated out of IV without difficulty.
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
## 2024-01-01 ENCOUNTER — Other Ambulatory Visit: Payer: Self-pay

## 2024-01-01 ENCOUNTER — Emergency Department (HOSPITAL_COMMUNITY)

## 2024-01-01 ENCOUNTER — Emergency Department (HOSPITAL_COMMUNITY)
Admission: EM | Admit: 2024-01-01 | Discharge: 2024-01-01 | Disposition: A | Discharge status: Home / Self Care | Attending: Emergency Medicine | Admitting: Emergency Medicine

## 2024-01-01 ENCOUNTER — Encounter (HOSPITAL_COMMUNITY): Payer: Self-pay

## 2024-01-01 DIAGNOSIS — M25512 Pain in left shoulder: Secondary | ICD-10-CM | POA: Diagnosis not present

## 2024-01-01 DIAGNOSIS — S0990XA Unspecified injury of head, initial encounter: Secondary | ICD-10-CM | POA: Diagnosis present

## 2024-01-01 DIAGNOSIS — M25562 Pain in left knee: Secondary | ICD-10-CM | POA: Diagnosis not present

## 2024-01-01 DIAGNOSIS — M79605 Pain in left leg: Secondary | ICD-10-CM | POA: Insufficient documentation

## 2024-01-01 MED ORDER — LEVETIRACETAM 500 MG PO TABS
500.0000 mg | ORAL_TABLET | Freq: Once | ORAL | Status: AC
  Administered 2024-01-01: 500 mg via ORAL
  Filled 2024-01-01: qty 1

## 2024-01-01 MED ORDER — ACETAMINOPHEN 500 MG PO TABS
1000.0000 mg | ORAL_TABLET | Freq: Once | ORAL | Status: AC
  Administered 2024-01-01: 1000 mg via ORAL
  Filled 2024-01-01: qty 2
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
## 2024-01-01 NOTE — Discharge Instructions (Signed)
 For pain control you may take 1000 mg of acetaminophen (Tylenol) every 8 hours and/or 600 mg of Ibuprofen (Motrin, Advil, etc.) every 6-8 hours as needed.  Please limit acetaminophen (Tylenol) to 4000 mg and Ibuprofen (Motrin, Advil, etc.) to 2400 mg for a 24hr period. Please note that other over-the-counter medicine may contain acetaminophen or ibuprofen as a component of their ingredients.

## 2024-01-17 ENCOUNTER — Emergency Department (HOSPITAL_COMMUNITY)
Admission: EM | Admit: 2024-01-17 | Discharge: 2024-01-17 | Discharge status: Against Medical Advice | Attending: Emergency Medicine | Admitting: Emergency Medicine

## 2024-01-17 ENCOUNTER — Encounter (HOSPITAL_COMMUNITY): Payer: Self-pay

## 2024-01-17 ENCOUNTER — Other Ambulatory Visit: Payer: Self-pay

## 2024-01-17 DIAGNOSIS — X31XXXA Exposure to excessive natural cold, initial encounter: Secondary | ICD-10-CM | POA: Diagnosis not present

## 2024-01-17 DIAGNOSIS — Z5321 Procedure and treatment not carried out due to patient leaving prior to being seen by health care provider: Secondary | ICD-10-CM | POA: Diagnosis not present

## 2024-01-17 DIAGNOSIS — R6883 Chills (without fever): Secondary | ICD-10-CM | POA: Diagnosis present

## 2024-01-17 NOTE — ED Triage Notes (Signed)
 Pt arrived via [facility] c/o cold exposure not sure how long.
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
## 2024-01-17 NOTE — ED Notes (Signed)
 Pt stated he was leaving.

## 2024-01-21 ENCOUNTER — Emergency Department (HOSPITAL_COMMUNITY)
Admission: EM | Admit: 2024-01-21 | Discharge: 2024-01-21 | Attending: Emergency Medicine | Admitting: Emergency Medicine

## 2024-01-21 ENCOUNTER — Encounter (HOSPITAL_COMMUNITY): Payer: Self-pay

## 2024-01-21 ENCOUNTER — Other Ambulatory Visit: Payer: Self-pay

## 2024-01-21 DIAGNOSIS — D649 Anemia, unspecified: Secondary | ICD-10-CM | POA: Insufficient documentation

## 2024-01-21 DIAGNOSIS — E870 Hyperosmolality and hypernatremia: Secondary | ICD-10-CM | POA: Diagnosis not present

## 2024-01-21 DIAGNOSIS — F10129 Alcohol abuse with intoxication, unspecified: Secondary | ICD-10-CM | POA: Insufficient documentation

## 2024-01-21 DIAGNOSIS — Y908 Blood alcohol level of 240 mg/100 ml or more: Secondary | ICD-10-CM | POA: Diagnosis not present

## 2024-01-21 DIAGNOSIS — R7401 Elevation of levels of liver transaminase levels: Secondary | ICD-10-CM | POA: Insufficient documentation

## 2024-01-21 DIAGNOSIS — R4182 Altered mental status, unspecified: Secondary | ICD-10-CM | POA: Diagnosis present

## 2024-01-21 DIAGNOSIS — F10920 Alcohol use, unspecified with intoxication, uncomplicated: Secondary | ICD-10-CM

## 2024-01-21 LAB — CBC WITH DIFFERENTIAL/PLATELET
Abs Immature Granulocytes: 0.01 K/uL (ref 0.00–0.07)
Basophils Absolute: 0 K/uL (ref 0.0–0.1)
Basophils Relative: 1 %
Eosinophils Absolute: 0.1 K/uL (ref 0.0–0.5)
Eosinophils Relative: 2 %
HCT: 38.9 % — ABNORMAL LOW (ref 39.0–52.0)
Hemoglobin: 12.1 g/dL — ABNORMAL LOW (ref 13.0–17.0)
Immature Granulocytes: 0 %
Lymphocytes Relative: 55 %
Lymphs Abs: 2.5 K/uL (ref 0.7–4.0)
MCH: 25.9 pg — ABNORMAL LOW (ref 26.0–34.0)
MCHC: 31.1 g/dL (ref 30.0–36.0)
MCV: 83.3 fL (ref 80.0–100.0)
Monocytes Absolute: 0.5 K/uL (ref 0.1–1.0)
Monocytes Relative: 12 %
Neutro Abs: 1.3 K/uL — ABNORMAL LOW (ref 1.7–7.7)
Neutrophils Relative %: 30 %
Platelets: 276 K/uL (ref 150–400)
RBC: 4.67 MIL/uL (ref 4.22–5.81)
RDW: 14.8 % (ref 11.5–15.5)
WBC: 4.3 K/uL (ref 4.0–10.5)
nRBC: 0 % (ref 0.0–0.2)

## 2024-01-21 LAB — COMPREHENSIVE METABOLIC PANEL WITH GFR
ALT: 57 U/L — ABNORMAL HIGH (ref 0–44)
AST: 49 U/L — ABNORMAL HIGH (ref 15–41)
Albumin: 4.7 g/dL (ref 3.5–5.0)
Alkaline Phosphatase: 99 U/L (ref 38–126)
Anion gap: 12 (ref 5–15)
BUN: <5 mg/dL — ABNORMAL LOW (ref 6–20)
CO2: 27 mmol/L (ref 22–32)
Calcium: 8.8 mg/dL — ABNORMAL LOW (ref 8.9–10.3)
Chloride: 109 mmol/L (ref 98–111)
Creatinine, Ser: 0.82 mg/dL (ref 0.61–1.24)
GFR, Estimated: >60 mL/min
Glucose, Bld: 94 mg/dL (ref 70–99)
Potassium: 3.5 mmol/L (ref 3.5–5.1)
Sodium: 149 mmol/L — ABNORMAL HIGH (ref 135–145)
Total Bilirubin: 0.4 mg/dL (ref 0.0–1.2)
Total Protein: 8.2 g/dL — ABNORMAL HIGH (ref 6.5–8.1)

## 2024-01-21 LAB — CK: Total CK: 346 U/L (ref 49–397)

## 2024-01-21 LAB — I-STAT CG4 LACTIC ACID, ED: Lactic Acid, Venous: 1 mmol/L (ref 0.5–1.9)

## 2024-01-21 LAB — ETHANOL: Alcohol, Ethyl (B): 301 mg/dL

## 2024-01-21 MED ORDER — SODIUM CHLORIDE 0.9 % IV BOLUS
1000.0000 mL | Freq: Once | INTRAVENOUS | Status: AC
  Administered 2024-01-21: 1000 mL via INTRAVENOUS

## 2024-01-21 MED ORDER — LEVETIRACETAM (KEPPRA) 500 MG/5 ML ADULT IV PUSH
1000.0000 mg | Freq: Once | INTRAVENOUS | Status: AC
  Administered 2024-01-21: 1000 mg via INTRAVENOUS
  Filled 2024-01-21: qty 10

## 2024-01-21 NOTE — ED Triage Notes (Signed)
 Arrives [facility] from jail in custody after a reported possible seizure.   No activity observed by staff.   Passing by staff found pt on floor. Since then has not been answering questions.   Does have a history of seizures with unknown  compliance on Keppra .

## 2024-01-21 NOTE — Discharge Instructions (Signed)
 Decreased alcohol  content take your seizure medicine.  Follow-up with your doctor next week
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
## 2024-02-16 ENCOUNTER — Emergency Department (HOSPITAL_COMMUNITY)

## 2024-02-16 ENCOUNTER — Other Ambulatory Visit: Payer: Self-pay

## 2024-02-16 ENCOUNTER — Emergency Department (HOSPITAL_COMMUNITY)
Admission: EM | Admit: 2024-02-16 | Discharge: 2024-02-16 | Disposition: A | Discharge status: Home / Self Care | Source: Home / Self Care | Attending: Emergency Medicine | Admitting: Emergency Medicine

## 2024-02-16 DIAGNOSIS — M25562 Pain in left knee: Secondary | ICD-10-CM | POA: Diagnosis present

## 2024-02-16 DIAGNOSIS — Y92410 Unspecified street and highway as the place of occurrence of the external cause: Secondary | ICD-10-CM | POA: Diagnosis not present

## 2024-02-16 DIAGNOSIS — R519 Headache, unspecified: Secondary | ICD-10-CM | POA: Insufficient documentation

## 2024-02-16 DIAGNOSIS — M542 Cervicalgia: Secondary | ICD-10-CM | POA: Insufficient documentation

## 2024-02-16 MED ORDER — ACETAMINOPHEN 500 MG PO TABS
1000.0000 mg | ORAL_TABLET | ORAL | Status: AC
  Administered 2024-02-16: 1000 mg via ORAL
  Filled 2024-02-16: qty 2
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
## 2024-02-20 ENCOUNTER — Emergency Department (HOSPITAL_COMMUNITY)
Admission: EM | Admit: 2024-02-20 | Discharge: 2024-02-20 | Disposition: A | Discharge status: Home / Self Care | Attending: Emergency Medicine | Admitting: Emergency Medicine

## 2024-02-20 ENCOUNTER — Other Ambulatory Visit (HOSPITAL_COMMUNITY): Payer: Self-pay

## 2024-02-20 ENCOUNTER — Other Ambulatory Visit: Payer: Self-pay

## 2024-02-20 ENCOUNTER — Encounter (HOSPITAL_COMMUNITY): Payer: Self-pay

## 2024-02-20 ENCOUNTER — Emergency Department (HOSPITAL_COMMUNITY)
Admission: EM | Admit: 2024-02-20 | Discharge: 2024-02-21 | Discharge status: Against Medical Advice | Source: Home / Self Care

## 2024-02-20 DIAGNOSIS — D72819 Decreased white blood cell count, unspecified: Secondary | ICD-10-CM | POA: Insufficient documentation

## 2024-02-20 DIAGNOSIS — R55 Syncope and collapse: Secondary | ICD-10-CM | POA: Insufficient documentation

## 2024-02-20 DIAGNOSIS — E876 Hypokalemia: Secondary | ICD-10-CM | POA: Diagnosis not present

## 2024-02-20 DIAGNOSIS — Z5321 Procedure and treatment not carried out due to patient leaving prior to being seen by health care provider: Secondary | ICD-10-CM | POA: Insufficient documentation

## 2024-02-20 DIAGNOSIS — F10120 Alcohol abuse with intoxication, uncomplicated: Secondary | ICD-10-CM | POA: Insufficient documentation

## 2024-02-20 DIAGNOSIS — Y908 Blood alcohol level of 240 mg/100 ml or more: Secondary | ICD-10-CM | POA: Insufficient documentation

## 2024-02-20 DIAGNOSIS — G40909 Epilepsy, unspecified, not intractable, without status epilepticus: Secondary | ICD-10-CM | POA: Diagnosis not present

## 2024-02-20 DIAGNOSIS — F10920 Alcohol use, unspecified with intoxication, uncomplicated: Secondary | ICD-10-CM

## 2024-02-20 DIAGNOSIS — Z7901 Long term (current) use of anticoagulants: Secondary | ICD-10-CM | POA: Insufficient documentation

## 2024-02-20 DIAGNOSIS — R569 Unspecified convulsions: Secondary | ICD-10-CM | POA: Diagnosis present

## 2024-02-20 LAB — URINALYSIS, ROUTINE W REFLEX MICROSCOPIC
Bilirubin Urine: NEGATIVE
Glucose, UA: NEGATIVE mg/dL
Hgb urine dipstick: NEGATIVE
Ketones, ur: NEGATIVE mg/dL
Leukocytes,Ua: NEGATIVE
Nitrite: NEGATIVE
Protein, ur: NEGATIVE mg/dL
Specific Gravity, Urine: 1.001 — ABNORMAL LOW (ref 1.005–1.030)
pH: 7 (ref 5.0–8.0)

## 2024-02-20 LAB — COMPREHENSIVE METABOLIC PANEL WITH GFR
ALT: 36 U/L (ref 0–44)
ALT: 31 U/L (ref 0–44)
AST: 37 U/L (ref 15–41)
AST: 39 U/L (ref 15–41)
Albumin: 4.5 g/dL (ref 3.5–5.0)
Albumin: 4.4 g/dL (ref 3.5–5.0)
Alkaline Phosphatase: 127 U/L — ABNORMAL HIGH (ref 38–126)
Alkaline Phosphatase: 119 U/L (ref 38–126)
Anion gap: 11 (ref 5–15)
Anion gap: 13 (ref 5–15)
BUN: <5 mg/dL — ABNORMAL LOW (ref 6–20)
BUN: <5 mg/dL — ABNORMAL LOW (ref 6–20)
CO2: 30 mmol/L (ref 22–32)
CO2: 26 mmol/L (ref 22–32)
Calcium: 8.9 mg/dL (ref 8.9–10.3)
Calcium: 8.7 mg/dL — ABNORMAL LOW (ref 8.9–10.3)
Chloride: 104 mmol/L (ref 98–111)
Chloride: 105 mmol/L (ref 98–111)
Creatinine, Ser: 0.9 mg/dL (ref 0.61–1.24)
Creatinine, Ser: 0.98 mg/dL (ref 0.61–1.24)
GFR, Estimated: >60 mL/min
GFR, Estimated: >60 mL/min
Glucose, Bld: 101 mg/dL — ABNORMAL HIGH (ref 70–99)
Glucose, Bld: 83 mg/dL (ref 70–99)
Potassium: 3.4 mmol/L — ABNORMAL LOW (ref 3.5–5.1)
Potassium: 3.4 mmol/L — ABNORMAL LOW (ref 3.5–5.1)
Sodium: 145 mmol/L (ref 135–145)
Sodium: 144 mmol/L (ref 135–145)
Total Bilirubin: 0.3 mg/dL (ref 0.0–1.2)
Total Bilirubin: 0.4 mg/dL (ref 0.0–1.2)
Total Protein: 8.2 g/dL — ABNORMAL HIGH (ref 6.5–8.1)
Total Protein: 8.1 g/dL (ref 6.5–8.1)

## 2024-02-20 LAB — CBC WITH DIFFERENTIAL/PLATELET
Abs Immature Granulocytes: 0.01 K/uL (ref 0.00–0.07)
Basophils Absolute: 0 K/uL (ref 0.0–0.1)
Basophils Relative: 1 %
Eosinophils Absolute: 0 K/uL (ref 0.0–0.5)
Eosinophils Relative: 1 %
HCT: 42.1 % (ref 39.0–52.0)
Hemoglobin: 13.7 g/dL (ref 13.0–17.0)
Immature Granulocytes: 0 %
Lymphocytes Relative: 51 %
Lymphs Abs: 2 K/uL (ref 0.7–4.0)
MCH: 27 pg (ref 26.0–34.0)
MCHC: 32.5 g/dL (ref 30.0–36.0)
MCV: 82.9 fL (ref 80.0–100.0)
Monocytes Absolute: 0.5 K/uL (ref 0.1–1.0)
Monocytes Relative: 12 %
Neutro Abs: 1.3 K/uL — ABNORMAL LOW (ref 1.7–7.7)
Neutrophils Relative %: 35 %
Platelets: 253 K/uL (ref 150–400)
RBC: 5.08 MIL/uL (ref 4.22–5.81)
RDW: 14.7 % (ref 11.5–15.5)
WBC: 3.9 K/uL — ABNORMAL LOW (ref 4.0–10.5)
nRBC: 0 % (ref 0.0–0.2)

## 2024-02-20 LAB — CBC
HCT: 42.6 % (ref 39.0–52.0)
Hemoglobin: 13.5 g/dL (ref 13.0–17.0)
MCH: 26.6 pg (ref 26.0–34.0)
MCHC: 31.7 g/dL (ref 30.0–36.0)
MCV: 83.9 fL (ref 80.0–100.0)
Platelets: 268 K/uL (ref 150–400)
RBC: 5.08 MIL/uL (ref 4.22–5.81)
RDW: 14.8 % (ref 11.5–15.5)
WBC: 3.4 K/uL — ABNORMAL LOW (ref 4.0–10.5)
nRBC: 0 % (ref 0.0–0.2)

## 2024-02-20 LAB — CBG MONITORING, ED: Glucose-Capillary: 85 mg/dL (ref 70–99)

## 2024-02-20 LAB — ETHANOL: Alcohol, Ethyl (B): 274 mg/dL — ABNORMAL HIGH

## 2024-02-20 MED ORDER — LEVETIRACETAM (KEPPRA) 500 MG/5 ML ADULT IV PUSH
1000.0000 mg | Freq: Once | INTRAVENOUS | Status: AC
  Administered 2024-02-20: 1000 mg via INTRAVENOUS
  Filled 2024-02-20: qty 10

## 2024-02-20 MED ORDER — LEVETIRACETAM 500 MG PO TABS
500.0000 mg | ORAL_TABLET | Freq: Two times a day (BID) | ORAL | 3 refills | Status: DC
  Filled 2024-02-20: qty 180, 90d supply, fill #0

## 2024-02-20 MED ORDER — POTASSIUM CHLORIDE CRYS ER 20 MEQ PO TBCR
40.0000 meq | EXTENDED_RELEASE_TABLET | Freq: Once | ORAL | Status: AC
  Administered 2024-02-20: 40 meq via ORAL
  Filled 2024-02-20: qty 2

## 2024-02-20 MED ORDER — POTASSIUM CHLORIDE CRYS ER 20 MEQ PO TBCR
20.0000 meq | EXTENDED_RELEASE_TABLET | Freq: Two times a day (BID) | ORAL | 0 refills | Status: AC
  Filled 2024-02-20: qty 10, 5d supply, fill #0

## 2024-02-20 NOTE — Discharge Instructions (Signed)
 Never your prescription for levetiracetam  (Keppra ) to run out.  If you do, you will be at risk for having another seizure.  Do not drink alcohol  since this can make it more likely for you to have a seizure.  Make sure to follow-up with your neurologist.

## 2024-02-20 NOTE — ED Triage Notes (Addendum)
 Pt BIB [facility]. Pt states he was outside since 1200 yesterday, has been feeling like he was going to have a seizure. Last seizure was x3 weeks ago. Pt states he is smelling a burning smell and this usually happens before a seizure. Hx of sz and takes keppra . GCS 15  136SBP 90P

## 2024-02-20 NOTE — ED Provider Notes (Signed)
 " [facility] DEPART[facility] AT [facility] Provider Note   CSN: [id number] Arrival date & time: 02/20/24  0435     Patient presents with: Cold Exposure and Seizures   [facility] is a 39 y.o. male.   The history is provided by the patient and the EMS personnel.  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  He has noticed a strange taste in his mouth which he usually has before he has a seizure.  He also states that he had been out in the cold since noon.  He is supposed to be on Keppra  500 mg twice daily, but states he ran out several days ago.    Prior to Admission medications  Medication Sig Start Date End Date Taking? Authorizing Provider  celecoxib  (CELEBREX ) 200 MG capsule Take 1 capsule (200 mg total) by mouth 2 (two) times daily as needed. 08/05/23   Silver Wonda LABOR, PA  cyclobenzaprine  (FLEXERIL ) 10 MG tablet Take 1 tablet (10 mg total) by mouth 2 (two) times daily as needed for muscle spasms. 08/05/23   Silver Wonda LABOR, PA  diphenhydrAMINE  (BANOPHEN ) 25 MG tablet Take 1 tablet (25 mg total) by mouth every 6 (six) hours as needed. 10/26/23   Sofia, Leslie K, PA-C  levETIRAcetam  (KEPPRA ) 500 MG tablet Take 1 tablet (500 mg total) by mouth 2 (two) times daily. 03/19/23 03/13/24  Bauer, Collin S, PA-C  levETIRAcetam  (KEPPRA ) 500 MG tablet Take 1 tablet (500 mg total) by mouth 2 (two) times daily. 06/27/23 07/27/23  Beverley Leita LABOR, PA-C  lidocaine  (LIDODERM ) 5 % Place 1 patch onto the skin daily. Remove & Discard patch within 12 hours or as directed by MD. 04/27/23   Palumbo, April, MD  APIXABAN  (ELIQUIS ) VTE STARTER PACK (10MG  AND 5MG ) Take as directed on package: start with two-5mg  tablets twice daily for 7 days. On day 8, switch to one-5mg  tablet twice daily. 04/26/20 04/26/20  Neldon Hamp RAMAN, PA    Allergies: Patient has no known allergies.     Review of Systems  Neurological:  Positive for seizures.  All other systems reviewed and are negative.   Updated Vital Signs BP (!) 123/93 (BP Location: Right Arm)   Pulse 74   Temp (!) 97.2 F (36.2 C) (Oral)   Resp 15   Ht 5' 10 (1.778 m)   Wt 73 kg   SpO2 100%   BMI 23.09 kg/m   Physical Exam Vitals and nursing note reviewed.   39 year old male, resting comfortably and in no acute distress. Vital signs are significant for borderline elevated diastolic blood pressure. Oxygen saturation is 100%, which is normal. Head is normocephalic and atraumatic. PERRLA, EOMI.  Lungs are clear without rales, wheezes, or rhonchi. Heart has regular rate and rhythm without murmur. Abdomen is soft, flat, nontender. Extremities have no cyanosis or edema, full range of motion is present. Skin is warm and dry without rash. Neurologic: Awake and alert and oriented, cranial nerves are intact, moves all extremities equally.  (all labs ordered are listed, but only abnormal results are displayed) Labs Reviewed  COMPREHENSIVE METABOLIC PANEL WITH GFR - Abnormal; Notable for the following components:      Result Value   Potassium 3.4 (*)    Glucose, Bld 101 (*)    BUN <5 (*)    Total Protein 8.2 (*)    Alkaline  Phosphatase 127 (*)    All other components within normal limits  CBC WITH DIFFERENTIAL/PLATELET - Abnormal; Notable for the following components:   WBC 3.9 (*)    Neutro Abs 1.3 (*)    All other components within normal limits  ETHANOL - Abnormal; Notable for the following components:   Alcohol , Ethyl (B) 274 (*)    All other components within normal limits    EKG: EKG Interpretation Date/Time:  Monday February 20 2024 04:44:13 EST Ventricular Rate:  76 PR Interval:  169 QRS Duration:  165 QT Interval:  386 QTC Calculation: 434 R Axis:   53  Text Interpretation: Sinus rhythm Nonspecific intraventricular conduction delay When compared with ECG of 04/28/2023, No significant  change was found Confirmed by Raford Lenis ([id number]) on 02/20/2024 5:01:44 AM   Cardiac monitor showed normal sinus rhythm, per my interpretation.  Procedures   Medications Ordered in the ED  levETIRAcetam  (KEPPRA ) undiluted injection 1,000 mg (1,000 mg Intravenous Given 02/20/24 0458)  potassium chloride  SA (KLOR-CON  M) CR tablet 40 mEq (40 mEq Oral Given 02/20/24 0535)                                    Medical Decision Making Amount and/or Complexity of Data Reviewed Labs: ordered.  Risk Prescription drug management.   Possible or indicating imminent seizure and patient with known history of seizure disorder.  I have reviewed his past records, and note several ED visits for seizures with most recent being on 01/29/2023.  Also, multiple ED visits for alcohol  intoxication most recently on 01/21/2024.  I have ordered screening labs and a loading dose of levetiracetam  intravenously.  I have reviewed his electrocardiogram, my interpretation is nonspecific intraventricular conduction delay unchanged from prior.  I have reviewed his laboratory tests, and my interpretation is borderline elevated random glucose level which is probably not clinically significant, mild hypokalemia, borderline elevated alkaline phosphatase not felt to be clinically significant, borderline leukopenia, markedly elevated ethanol level consistent with ethanol intoxication.  I have advised him that he should abstain from alcohol  since it can lower the seizure threshold.  I have ordered a dose of oral potassium.  I am discharging him with a prescription for levetiracetam  and oral potassium.  Patient states that he has an appointment with his neurologist next month.  He is to keep that appointment.     Final diagnoses:  Alcohol  intoxication, uncomplicated  Seizure disorder (HCC)  Hypokalemia  Leukopenia, unspecified type    ED Discharge Orders          Ordered    levETIRAcetam  (KEPPRA ) 500 MG tablet  2 times daily         02/20/24 0536    potassium chloride  SA (KLOR-CON  M) 20 MEQ tablet  2 times daily        02/20/24 0536               Raford Lenis, MD 02/20/24 [phone]  "

## 2024-02-20 NOTE — ED Triage Notes (Signed)
 Bib [facility] for alcohol  intoxication and he feels like he is going to have a seizure. History of seizures and has not taken his keppra  today.

## 2024-02-21 NOTE — ED Notes (Signed)
 Pt. Walked out of lobby.

## 2024-02-22 ENCOUNTER — Emergency Department (HOSPITAL_COMMUNITY)
Admission: EM | Admit: 2024-02-22 | Discharge: 2024-02-22 | Disposition: A | Discharge status: Home / Self Care | Attending: Emergency Medicine | Admitting: Emergency Medicine

## 2024-02-22 ENCOUNTER — Other Ambulatory Visit (HOSPITAL_COMMUNITY): Payer: Self-pay

## 2024-02-22 ENCOUNTER — Other Ambulatory Visit: Payer: Self-pay

## 2024-02-22 ENCOUNTER — Emergency Department (HOSPITAL_COMMUNITY)

## 2024-02-22 DIAGNOSIS — M25572 Pain in left ankle and joints of left foot: Secondary | ICD-10-CM | POA: Insufficient documentation

## 2024-02-22 DIAGNOSIS — Z5900 Homelessness unspecified: Secondary | ICD-10-CM | POA: Insufficient documentation

## 2024-02-22 DIAGNOSIS — Z7901 Long term (current) use of anticoagulants: Secondary | ICD-10-CM | POA: Diagnosis not present

## 2024-02-22 DIAGNOSIS — E876 Hypokalemia: Secondary | ICD-10-CM | POA: Diagnosis not present

## 2024-02-22 LAB — CBG MONITORING, ED: Glucose-Capillary: 75 mg/dL (ref 70–99)

## 2024-02-22 MED ORDER — ACETAMINOPHEN 325 MG PO TABS
650.0000 mg | ORAL_TABLET | Freq: Once | ORAL | Status: AC
  Administered 2024-02-22: 650 mg via ORAL
  Filled 2024-02-22: qty 2

## 2024-02-22 MED ORDER — LEVETIRACETAM ER 500 MG PO TB24
500.0000 mg | ORAL_TABLET | Freq: Once | ORAL | Status: AC
  Administered 2024-02-22: 500 mg via ORAL
  Filled 2024-02-22: qty 1

## 2024-02-22 MED ORDER — LEVETIRACETAM 500 MG PO TABS: 500.0000 mg | ORAL_TABLET | Freq: Two times a day (BID) | ORAL | 3 refills | Status: DC

## 2024-02-22 MED ORDER — LEVETIRACETAM ER 500 MG PO TB24: 500.0000 mg | ORAL_TABLET | Freq: Every day | ORAL | Status: DC

## 2024-02-22 MED ORDER — LEVETIRACETAM 500 MG PO TABS: 500.0000 mg | ORAL_TABLET | Freq: Two times a day (BID) | ORAL | 3 refills | Status: AC
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
## 2024-02-22 NOTE — Progress Notes (Signed)
 Orthopedic Tech Progress Note Patient Details:  Perry Day 1985-03-03 [id number] Applied ASO lace up brace per order.  Ortho Devices Type of Ortho Device: ASO Ortho Device/Splint Location: LLE Ortho Device/Splint Interventions: Ordered, Application, Adjustment   Post Interventions Patient Tolerated: Well Instructions Provided: Adjustment of device, Care of device, Poper ambulation with device  Morna Pink 02/22/2024, 4:58 AM
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
## 2024-02-22 NOTE — ED Notes (Signed)
 Patient was given something to eat and discharge instructions. Ortho tech at bedside applying brace.
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
## 2024-02-22 NOTE — ED Notes (Signed)
 Pt can go to lobby per PA Ruthell

## 2024-03-01 ENCOUNTER — Other Ambulatory Visit (HOSPITAL_COMMUNITY): Payer: Self-pay

## 2024-03-28 ENCOUNTER — Ambulatory Visit: Admitting: Neurology
